# Patient Record
Sex: Male | Born: 1975
Health system: Southern US, Community
[De-identification: ages and names within clinical notes are randomized; demographics above are authoritative.]

## PROBLEM LIST (undated history)

## (undated) DIAGNOSIS — N62 Hypertrophy of breast: Secondary | ICD-10-CM

## (undated) DIAGNOSIS — F411 Generalized anxiety disorder: Secondary | ICD-10-CM

## (undated) DIAGNOSIS — M25519 Pain in unspecified shoulder: Secondary | ICD-10-CM

## (undated) DIAGNOSIS — J309 Allergic rhinitis, unspecified: Secondary | ICD-10-CM

## (undated) DIAGNOSIS — F909 Attention-deficit hyperactivity disorder, unspecified type: Secondary | ICD-10-CM

## (undated) HISTORY — DX: Pain in unspecified shoulder: M25.519

## (undated) HISTORY — DX: Attention-deficit hyperactivity disorder, unspecified type: F90.9

## (undated) HISTORY — PX: LASIK: SHX215

## (undated) HISTORY — DX: Hypertrophy of breast: N62

## (undated) HISTORY — DX: Allergic rhinitis, unspecified: J30.9

## (undated) HISTORY — DX: Generalized anxiety disorder: F41.1

---

## 2006-05-05 ENCOUNTER — Encounter: Admission: RE | Admit: 2006-05-05 | Discharge: 2006-05-05 | Payer: Self-pay | Admitting: Internal Medicine

## 2008-10-09 ENCOUNTER — Ambulatory Visit: Payer: Self-pay | Admitting: Internal Medicine

## 2008-10-09 DIAGNOSIS — F411 Generalized anxiety disorder: Secondary | ICD-10-CM

## 2008-10-09 DIAGNOSIS — J309 Allergic rhinitis, unspecified: Secondary | ICD-10-CM

## 2008-10-09 HISTORY — DX: Generalized anxiety disorder: F41.1

## 2008-10-09 HISTORY — DX: Allergic rhinitis, unspecified: J30.9

## 2008-10-19 ENCOUNTER — Ambulatory Visit: Payer: Self-pay | Admitting: Internal Medicine

## 2008-10-19 DIAGNOSIS — M25519 Pain in unspecified shoulder: Secondary | ICD-10-CM | POA: Insufficient documentation

## 2008-10-19 HISTORY — DX: Pain in unspecified shoulder: M25.519

## 2009-01-23 ENCOUNTER — Ambulatory Visit: Payer: Self-pay | Admitting: Internal Medicine

## 2009-01-23 DIAGNOSIS — IMO0002 Reserved for concepts with insufficient information to code with codable children: Secondary | ICD-10-CM | POA: Insufficient documentation

## 2009-02-05 ENCOUNTER — Ambulatory Visit: Payer: Self-pay | Admitting: Family Medicine

## 2009-04-10 ENCOUNTER — Ambulatory Visit: Payer: Self-pay | Admitting: Internal Medicine

## 2009-07-23 ENCOUNTER — Ambulatory Visit: Payer: Self-pay | Admitting: Internal Medicine

## 2009-07-23 LAB — CONVERTED CEMR LAB
ALT: 35 units/L (ref 0–53)
Albumin: 4.4 g/dL (ref 3.5–5.2)
BUN: 8 mg/dL (ref 6–23)
Basophils Relative: 0.5 % (ref 0.0–3.0)
Bilirubin Urine: NEGATIVE
CO2: 28 meq/L (ref 19–32)
Chloride: 104 meq/L (ref 96–112)
Cholesterol: 155 mg/dL (ref 0–200)
Creatinine, Ser: 0.6 mg/dL (ref 0.4–1.5)
Eosinophils Absolute: 0.1 10*3/uL (ref 0.0–0.7)
Eosinophils Relative: 1 % (ref 0.0–5.0)
HCT: 41.6 % (ref 39.0–52.0)
Ketones, urine, test strip: NEGATIVE
LDL Cholesterol: 93 mg/dL (ref 0–99)
Lymphs Abs: 2.8 10*3/uL (ref 0.7–4.0)
MCHC: 34.5 g/dL (ref 30.0–36.0)
MCV: 99.2 fL (ref 78.0–100.0)
Monocytes Absolute: 0.5 10*3/uL (ref 0.1–1.0)
Neutrophils Relative %: 54.9 % (ref 43.0–77.0)
Potassium: 4 meq/L (ref 3.5–5.1)
RBC: 4.19 M/uL — ABNORMAL LOW (ref 4.22–5.81)
Specific Gravity, Urine: 1.01
TSH: 0.73 microintl units/mL (ref 0.35–5.50)
Total CHOL/HDL Ratio: 3
Total Protein: 6.9 g/dL (ref 6.0–8.3)
Triglycerides: 65 mg/dL (ref 0.0–149.0)
WBC: 7.5 10*3/uL (ref 4.5–10.5)

## 2009-07-27 ENCOUNTER — Ambulatory Visit: Payer: Self-pay | Admitting: Internal Medicine

## 2009-10-01 ENCOUNTER — Ambulatory Visit: Payer: Self-pay | Admitting: Internal Medicine

## 2009-10-01 DIAGNOSIS — J069 Acute upper respiratory infection, unspecified: Secondary | ICD-10-CM | POA: Insufficient documentation

## 2009-10-18 ENCOUNTER — Telehealth: Payer: Self-pay | Admitting: Internal Medicine

## 2010-04-16 ENCOUNTER — Ambulatory Visit: Payer: Self-pay | Admitting: Internal Medicine

## 2010-04-16 DIAGNOSIS — N62 Hypertrophy of breast: Secondary | ICD-10-CM | POA: Insufficient documentation

## 2010-04-16 HISTORY — DX: Hypertrophy of breast: N62

## 2010-07-22 ENCOUNTER — Ambulatory Visit: Payer: Self-pay | Admitting: Internal Medicine

## 2010-07-22 LAB — CONVERTED CEMR LAB
ALT: 43 units/L (ref 0–53)
AST: 24 units/L (ref 0–37)
Albumin: 4.5 g/dL (ref 3.5–5.2)
Basophils Absolute: 0 10*3/uL (ref 0.0–0.1)
Bilirubin Urine: NEGATIVE
CO2: 27 meq/L (ref 19–32)
Chloride: 102 meq/L (ref 96–112)
Cholesterol: 165 mg/dL (ref 0–200)
Eosinophils Relative: 1.9 % (ref 0.0–5.0)
GFR calc non Af Amer: 150.1 mL/min (ref >60)
Glucose, Bld: 85 mg/dL (ref 70–99)
Glucose, Urine, Semiquant: NEGATIVE
HCT: 42.4 % (ref 39.0–52.0)
Hemoglobin: 14.4 g/dL (ref 13.0–17.0)
Lymphs Abs: 2.7 10*3/uL (ref 0.7–4.0)
MCV: 96.7 fL (ref 78.0–100.0)
Monocytes Absolute: 0.5 10*3/uL (ref 0.1–1.0)
Monocytes Relative: 8 % (ref 3.0–12.0)
Neutro Abs: 2.4 10*3/uL (ref 1.4–7.7)
Potassium: 4.6 meq/L (ref 3.5–5.1)
RDW: 12.1 % (ref 11.5–14.6)
Sodium: 138 meq/L (ref 135–145)
Specific Gravity, Urine: <1.005
TSH: 0.87 microintl units/mL (ref 0.35–5.50)
VLDL: 13 mg/dL (ref 0.0–40.0)
WBC Urine, dipstick: NEGATIVE
pH: 5.5

## 2010-07-29 ENCOUNTER — Ambulatory Visit: Payer: Self-pay | Admitting: Internal Medicine

## 2010-09-24 NOTE — Progress Notes (Signed)
Summary: substitute  Phone Note From Pharmacy   Caller: Annalee Genta.* # [phone]* Call For: Dr. Kirtland Bouchard  Summary of Call: Can they substitute Hycodan?  Rondec DM?  Hydrotuss discontinued. [phone]  [facility] Initial call taken by: Lynann Beaver CMA,  October 18, 2009 3:53 PM  Follow-up for Phone Call        yes; generic med OK  Follow-up by: Gordy Savers  MD,  October 18, 2009 4:54 PM  Additional Follow-up for Phone Call Additional follow up Details #1::        Pharmacy notified. Additional Follow-up by: Lynann Beaver CMA,  October 18, 2009 4:57 PM

## 2010-09-24 NOTE — Assessment & Plan Note (Signed)
Summary: cpx/cjr   Vital Signs:  Patient profile:   35 year old male Height:      65.5 inches Weight:      199 pounds BMI:     32.73 Temp:     98.3 degrees F oral BP sitting:   112 / 80  (right arm) Cuff size:   regular  Vitals Entered By: Duard Brady LPN (July 29, 2010 3:29 PM) CC: cpx - doing well Is Patient Diabetic? No   CC:  cpx - doing well.  History of Present Illness: 35 year old patient who is seen today for an annual examination.  He is in quite well and does have a history gynecomastia since age 35.  No new concerns or complaints.  He has chronic left shoulder pain, anxiety, and allergic rhinitis.  Preventive Screening-Counseling & Management  Alcohol-Tobacco     Smoking Status: never  Allergies (verified): No Known Drug Allergies  Past History:  Past Medical History: Allergic rhinitis Anxiety left shoulder pain gynecomastia exogenous obesity  Past Surgical History: Reviewed history from 10/09/2008 and no changes required. LASIK surgery  Family History: Reviewed history from 07/27/2009 and no changes required. father age 33 history tobacco use, but no medical illnesses; Bilat carotid atery stenosis; s/p CABG  mother, age 35 history multiple myeloma-status post stem cell transplantation July 2008 and presently in remission  One stepsister one stepbrother, both in good health  Social History: Reviewed history from 10/09/2008 and no changes required. 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    Physical Exam  General:  overweight-appearing.  normal blood pressureoverweight-appearing.   Head:  Normocephalic and atraumatic without obvious abnormalities. No apparent alopecia or balding. Eyes:  No corneal or conjunctival inflammation noted. EOMI. Perrla. Funduscopic exam benign, without hemorrhages, exudates or papilledema. Vision grossly normal. Ears:  External ear exam shows no significant lesions or deformities.  Otoscopic examination reveals clear canals, tympanic membranes are intact bilaterally without bulging, retraction, inflammation or discharge. Hearing is grossly normal bilaterally. Nose:  External nasal examination shows no deformity or inflammation. Nasal mucosa are pink and moist without lesions or exudates. Mouth:  Oral mucosa and oropharynx without lesions or exudates.  Teeth in good repair. Neck:  No deformities, masses, or tenderness noted. Chest Wall:  No deformities, masses, tenderness or gynecomastia noted. Breasts:  gynecomastia.  gynecomastia.   Lungs:  Normal respiratory effort, chest expands symmetrically. Lungs are clear to auscultation, no crackles or wheezes. Heart:  Normal rate and regular rhythm. S1 and S2 normal without gallop, murmur, click, rub or other extra sounds. Abdomen:  Bowel sounds positive,abdomen soft and non-tender without masses, organomegaly or hernias noted. Genitalia:  Testes bilaterally descended without nodularity, tenderness or masses. No scrotal masses or lesions. No penis lesions or urethral discharge. Msk:  No deformity or scoliosis noted of thoracic or lumbar spine.   Pulses:  R and L carotid,radial,femoral,dorsalis pedis and posterior tibial pulses are full and equal bilaterally Extremities:  No clubbing, cyanosis, edema, or deformity noted with normal full range of motion of all joints.   Neurologic:  No  cranial nerve deficits  noted. Station and gait are normal. Plantar reflexes are down-going bilaterally. DTRs are symmetrical throughout. Sensory, motor and coordinative functions appear intact. Skin:  Intact without suspicious lesions or rashes Cervical Nodes:  No lymphadenopathy noted Axillary Nodes:  No palpable lymphadenopathy Inguinal Nodes:  No significant adenopathy Psych:  Cognition and judgment appear intact. Alert and cooperative with normal attention span and concentration. No apparent delusions, illusions, hallucinations   Impression & Recommendations:  Problem # 1:  Preventive Health Care (ICD-V70.0)  Complete Medication List: 1)  Alprazolam 0.5 Mg Tbdp (Alprazolam) .Marland Kitchen.. 1-2 as needed 2)  Zoloft 50 Mg Tabs (Sertraline hcl) .Marland Kitchen.. 1 once daily 3)  Flonase 50 Mcg/act Susp (Fluticasone propionate) .... Uad 4)  Hydrocodone-acetaminophen 5-500 Mg Tabs (Hydrocodone-acetaminophen) .... One twice daily as needed for pain 5)  Astepro 0.15 % Soln (Azelastine hcl) .... Daily 6)  Allegra-d 12 Hour 60-120 Mg Xr12h-tab (Fexofenadine-pseudoephedrine) .... Once daily prn  Patient Instructions: 1)  Please schedule a follow-up appointment in 1 year. 2)  Limit your Sodium (Salt) to less than 2 grams a day(slightly less than 1/2 a teaspoon) to prevent fluid retention, swelling, or worsening of symptoms. 3)  It is important that you exercise regularly at least 20 minutes 5 times a week. If you develop chest pain, have severe difficulty breathing, or feel very tired , stop exercising immediately and seek medical attention. 4)  You need to lose weight. Consider a lower calorie diet and regular exercise.  Prescriptions: ASTEPRO 0.15 % SOLN (AZELASTINE HCL) daily  #3 x 6   Entered and Authorized by:   Gordy Savers  MD   Signed by:   Gordy Savers  MD on 07/29/2010   Method used:   Print then Give to Patient   RxID:   [id number] HYDROCODONE-ACETAMINOPHEN 5-500 MG TABS  (HYDROCODONE-ACETAMINOPHEN) one twice daily as needed for pain  #50 x 2   Entered and Authorized by:   Gordy Savers  MD   Signed by:   Gordy Savers  MD on 07/29/2010   Method used:   Print then Give to Patient   RxID:   [id number] FLONASE 50 MCG/ACT SUSP (FLUTICASONE PROPIONATE) UAD  #3 x 5   Entered and Authorized by:   Gordy Savers  MD   Signed by:   Gordy Savers  MD on 07/29/2010   Method used:   Print then Give to Patient   RxID:   [id number] ZOLOFT 50 MG TABS (SERTRALINE HCL) 1 once daily  #90 x 5   Entered and Authorized by:   Gordy Savers  MD   Signed by:   Gordy Savers  MD on 07/29/2010   Method used:   Print then Give to Patient   RxID:   [id number] ALPRAZOLAM 0.5 MG TBDP (ALPRAZOLAM) 1-2 as needed  #50 x 3   Entered and Authorized by:   Gordy Savers  MD   Signed by:   Gordy Savers  MD on 07/29/2010   Method used:   Print then Give to Patient   RxID:   [id number]    Orders Added: 1)  Est. Patient 18-39 years [30160]   Immunization History:  Influenza Immunization History:    Influenza:  Historical (06/25/2010) flu vaccine recv'd at work per pt. KIk  Immunization History:  Influenza Immunization History:    Influenza:  Historical (06/25/2010)

## 2010-09-24 NOTE — Progress Notes (Signed)
Summary: wants Rx  Phone Note Call from Patient Call back at Home Phone [phone]   Caller: Patient Call For: Gordy Savers  MD Summary of Call: wants Rx called in- c/o temp 100, chills, dry cough and achy. [facility], [place] Initial call taken by: Raechel Ache, RN,  October 18, 2009 8:54 AM  Follow-up for Phone Call        generic hydrotuss 6 oz one tsp every  6 hrs for cough Follow-up by: Gordy Savers  MD,  October 18, 2009 12:31 PM  Additional Follow-up for Phone Call Additional follow up Details #1::        Rx Called In Additional Follow-up by: Raechel Ache, RN,  October 18, 2009 12:43 PM

## 2010-09-24 NOTE — Assessment & Plan Note (Signed)
Summary: MED CHECK/REFILL/CJR   Vital Signs:  Patient profile:   35 year old male Weight:      197 pounds Temp:     98.1 degrees F oral BP sitting:   120 / 80  (left arm) Cuff size:   regular  Vitals Entered By: Duard Brady LPN (April 16, 2010 1:54 PM) CC: medication review with refills Is Patient Diabetic? No   CC:  medication review with refills.  History of Present Illness:  35 year old patient who is seen today for follow-up.  He has a history of anxiety, which has has been well-controlled.  He is contemplating liposuction for bilateral gynecomastia which is had since age 27.  He has chronic left shoulder pain due to a history of frequent dislocations.  He takes analgesics occasionally for pain.  a pain contract was reviewed and signed today.  Preventive Screening-Counseling & Management  Alcohol-Tobacco     Smoking Status: never  Allergies (verified): No Known Drug Allergies  Past History:  Past Medical History: Reviewed history from 10/19/2008 and no changes required. Allergic rhinitis Anxiety left shoulder pain  Past Surgical History: Reviewed history from 10/09/2008 and no changes required. LASIK surgery  Social History: Smoking Status:  never  Physical Exam  General:  overweight-appearing.   130/80 Head:  Normocephalic and atraumatic without obvious abnormalities. No apparent alopecia or balding. Eyes:  No corneal or conjunctival inflammation noted. EOMI. Perrla. Funduscopic exam benign, without hemorrhages, exudates or papilledema. Vision grossly normal. Mouth:  Oral mucosa and oropharynx without lesions or exudates.  Teeth in good repair. Neck:  No deformities, masses, or tenderness noted. Breasts:  gynecomastia noted Lungs:  Normal respiratory effort, chest expands symmetrically. Lungs are clear to auscultation, no crackles or wheezes. Heart:  Normal rate and regular rhythm. S1 and S2 normal without gallop, murmur, click, rub or other extra  sounds. Abdomen:  Bowel sounds positive,abdomen soft and non-tender without masses, organomegaly or hernias noted. Msk:  No deformity or scoliosis noted of thoracic or lumbar spine.   Pulses:  R and L carotid,radial,femoral,dorsalis pedis and posterior tibial pulses are full and equal bilaterally Extremities:  No clubbing, cyanosis, edema, or deformity noted with normal full range of motion of all joints.     Impression & Recommendations:  Problem # 1:  SHOULDER PAIN, LEFT, CHRONIC (ICD-719.41)  His updated medication list for this problem includes:    Hydrocodone-acetaminophen 5-500 Mg Tabs (Hydrocodone-acetaminophen) ..... One twice daily as needed for pain  Problem # 2:  ANXIETY (ICD-300.00)  His updated medication list for this problem includes:    Alprazolam 0.5 Mg Tbdp (Alprazolam) .Marland Kitchen... 1-2 as needed    Zoloft 50 Mg Tabs (Sertraline hcl) .Marland Kitchen... 1 once daily  Problem # 3:  GYNECOMASTIA (ICD-611.1)  Complete Medication List: 1)  Alprazolam 0.5 Mg Tbdp (Alprazolam) .Marland Kitchen.. 1-2 as needed 2)  Zoloft 50 Mg Tabs (Sertraline hcl) .Marland Kitchen.. 1 once daily 3)  Flonase 50 Mcg/act Susp (Fluticasone propionate) .... Uad 4)  Hydrocodone-acetaminophen 5-500 Mg Tabs (Hydrocodone-acetaminophen) .... One twice daily as needed for pain 5)  Astepro 0.15 % Soln (Azelastine hcl) .... Daily 6)  Allegra-d 12 Hour 60-120 Mg Xr12h-tab (Fexofenadine-pseudoephedrine) .... Once daily prn  Patient Instructions: 1)  Please schedule a follow-up appointment in 6 months. 2)  It is important that you exercise regularly at least 20 minutes 5 times a week. If you develop chest pain, have severe difficulty breathing, or feel very tired , stop exercising immediately and seek medical attention. 3)  You  need to lose weight. Consider a lower calorie diet and regular exercise.  Prescriptions: HYDROCODONE-ACETAMINOPHEN 5-500 MG TABS (HYDROCODONE-ACETAMINOPHEN) one twice daily as needed for pain  #50 x 2   Entered and Authorized  by:   Gordy Savers  MD   Signed by:   Gordy Savers  MD on 04/16/2010   Method used:   Print then Give to Patient   RxID:   [id number] ASTEPRO 0.15 % SOLN (AZELASTINE HCL) daily  #3 x 6   Entered and Authorized by:   Gordy Savers  MD   Signed by:   Gordy Savers  MD on 04/16/2010   Method used:   Print then Give to Patient   RxID:   [id number] FLONASE 50 MCG/ACT SUSP (FLUTICASONE PROPIONATE) UAD  #3 x 5   Entered and Authorized by:   Gordy Savers  MD   Signed by:   Gordy Savers  MD on 04/16/2010   Method used:   Print then Give to Patient   RxID:   [id number] ZOLOFT 50 MG TABS (SERTRALINE HCL) 1 once daily  #90 x 5   Entered and Authorized by:   Gordy Savers  MD   Signed by:   Gordy Savers  MD on 04/16/2010   Method used:   Print then Give to Patient   RxID:   [id number]   Appended Document: MED CHECK/REFILL/CJR    Clinical Lists Changes  Medications: Changed medication from ALPRAZOLAM 0.5 MG TBDP (ALPRAZOLAM) 1-2 as needed to ALPRAZOLAM 0.5 MG TBDP (ALPRAZOLAM) 1-2 as needed Changed medication from HYDROCODONE-ACETAMINOPHEN 5-500 MG TABS (HYDROCODONE-ACETAMINOPHEN) one twice daily as needed for pain to HYDROCODONE-ACETAMINOPHEN 5-500 MG TABS (HYDROCODONE-ACETAMINOPHEN) one twice daily as needed for pain

## 2010-09-24 NOTE — Miscellaneous (Signed)
Summary: Controlled Substances Contract  Controlled Substances Contract   Imported By: Maryln Gottron 04/18/2010 14:03:56  _____________________________________________________________________  External Attachment:    Type:   Image     Comment:   External Document

## 2010-09-24 NOTE — Assessment & Plan Note (Signed)
Summary: CONCERNS W/ MED // RS   Vital Signs:  Patient profile:   35 year old male Weight:      182 pounds Temp:     98.3 degrees F oral BP sitting:   122 / 80  (right arm) Cuff size:   regular  Vitals Entered By: Raechel Ache, RN (October 01, 2009 11:32 AM) CC: cough, sore throat, joint aches   CC:  cough, sore throat, and joint aches.  History of Present Illness: 35 year old patient history allergic rhinitis, who presents with a 3-day history of sore throat, cough, generalized myalgias.  He has a history of chronic left shoulder pain that worsens during the winter and largely resolves during the warmer months.  Denies any fever, shortness of breath, chest pain or purulent sputum production  Allergies: No Known Drug Allergies  Past History:  Past Medical History: Reviewed history from 10/19/2008 and no changes required. Allergic rhinitis Anxiety left shoulder pain  Review of Systems       The patient complains of prolonged cough.  The patient denies anorexia, fever, weight loss, weight gain, vision loss, decreased hearing, hoarseness, chest pain, syncope, dyspnea on exertion, peripheral edema, headaches, hemoptysis, abdominal pain, melena, hematochezia, severe indigestion/heartburn, hematuria, incontinence, genital sores, muscle weakness, suspicious skin lesions, transient blindness, difficulty walking, depression, unusual weight change, abnormal bleeding, enlarged lymph nodes, angioedema, breast masses, and testicular masses.    Physical Exam  General:  Well-developed,well-nourished,in no acute distress; alert,appropriate and cooperative throughout examination Head:  Normocephalic and atraumatic without obvious abnormalities. No apparent alopecia or balding. Eyes:  No corneal or conjunctival inflammation noted. EOMI. Perrla. Funduscopic exam benign, without hemorrhages, exudates or papilledema. Vision grossly normal. Ears:  External ear exam shows no significant lesions  or deformities.  Otoscopic examination reveals clear canals, tympanic membranes are intact bilaterally without bulging, retraction, inflammation or discharge. Hearing is grossly normal bilaterally. Mouth:  Oral mucosa and oropharynx without lesions or exudates.  Teeth in good repair. Neck:  No deformities, masses, or tenderness noted. Lungs:  Normal respiratory effort, chest expands symmetrically. Lungs are clear to auscultation, no crackles or wheezes. Heart:  Normal rate and regular rhythm. S1 and S2 normal without gallop, murmur, click, rub or other extra sounds.   Impression & Recommendations:  Problem # 1:  URI (ICD-465.9)  His updated medication list for this problem includes:    Hydrocodone-homatropine 5-1.5 Mg/59ml Syrp (Hydrocodone-homatropine) .Marland Kitchen... 1 teaspoon every 6 hours as needed for cough  His updated medication list for this problem includes:    Hydrocodone-homatropine 5-1.5 Mg/32ml Syrp (Hydrocodone-homatropine) .Marland Kitchen... 1 teaspoon every 6 hours as needed for cough  Problem # 2:  SHOULDER PAIN, LEFT, CHRONIC (ICD-719.41)  His updated medication list for this problem includes:    Hydrocodone-acetaminophen 5-500 Mg Tabs (Hydrocodone-acetaminophen) ..... One twice daily as needed for pain  His updated medication list for this problem includes:    Hydrocodone-acetaminophen 5-500 Mg Tabs (Hydrocodone-acetaminophen) ..... One twice daily as needed for pain  Problem # 3:  ALLERGIC RHINITIS (ICD-477.9)  His updated medication list for this problem includes:    Flonase 50 Mcg/act Susp (Fluticasone propionate) ..... Uad    Astepro 0.15 % Soln (Azelastine hcl) .Marland Kitchen... Daily  His updated medication list for this problem includes:    Flonase 50 Mcg/act Susp (Fluticasone propionate) ..... Uad    Astepro 0.15 % Soln (Azelastine hcl) .Marland Kitchen... Daily  Complete Medication List: 1)  Alprazolam 0.5 Mg Tbdp (Alprazolam) .Marland Kitchen.. 1-2 as needed 2)  Zoloft 50  Mg Tabs (Sertraline hcl) .Marland Kitchen.. 1 once  daily 3)  Flonase 50 Mcg/act Susp (Fluticasone propionate) .... Uad 4)  Hydrocodone-acetaminophen 5-500 Mg Tabs (Hydrocodone-acetaminophen) .... One twice daily as needed for pain 5)  Astepro 0.15 % Soln (Azelastine hcl) .... Daily 6)  Hydrocodone-homatropine 5-1.5 Mg/29ml Syrp (Hydrocodone-homatropine) .Marland Kitchen.. 1 teaspoon every 6 hours as needed for cough  Patient Instructions: 1)  Get plenty of rest, drink lots of clear liquids, and use Tylenol or Ibuprofen for fever and comfort. Return in 7-10 days if you're not better:sooner if you're feeling worse. Prescriptions: HYDROCODONE-HOMATROPINE 5-1.5 MG/5ML SYRP (HYDROCODONE-HOMATROPINE) 1 teaspoon every 6 hours as needed for cough  #6 oz x 0   Entered and Authorized by:   Gordy Savers  MD   Signed by:   Gordy Savers  MD on 10/01/2009   Method used:   Print then Give to Patient   RxID:   [id number] ASTEPRO 0.15 % SOLN (AZELASTINE HCL) daily  #3 x 6   Entered and Authorized by:   Gordy Savers  MD   Signed by:   Gordy Savers  MD on 10/01/2009   Method used:   Print then Give to Patient   RxID:   [id number] FLONASE 50 MCG/ACT SUSP (FLUTICASONE PROPIONATE) UAD  #3 x 5   Entered and Authorized by:   Gordy Savers  MD   Signed by:   Gordy Savers  MD on 10/01/2009   Method used:   Print then Give to Patient   RxID:   [id number] HYDROCODONE-ACETAMINOPHEN 5-500 MG TABS (HYDROCODONE-ACETAMINOPHEN) one twice daily as needed for pain  #50 x 3   Entered and Authorized by:   Gordy Savers  MD   Signed by:   Gordy Savers  MD on 10/01/2009   Method used:   Print then Give to Patient   RxID:   [id number] ZOLOFT 50 MG TABS (SERTRALINE HCL) 1 once daily  #90 x 5   Entered and Authorized by:   Gordy Savers  MD   Signed by:   Gordy Savers  MD on 10/01/2009   Method used:   Print then Give to Patient   RxID:   [id number] ALPRAZOLAM 0.5 MG TBDP (ALPRAZOLAM) 1-2 as  needed  #50 x 3   Entered and Authorized by:   Gordy Savers  MD   Signed by:   Gordy Savers  MD on 10/01/2009   Method used:   Print then Give to Patient   RxID:   [id number]

## 2010-10-28 ENCOUNTER — Other Ambulatory Visit: Payer: Self-pay | Admitting: Internal Medicine

## 2010-10-28 NOTE — Telephone Encounter (Signed)
OK #50; does patient desire referral to ortho for shouilder pain?

## 2010-10-28 NOTE — Telephone Encounter (Signed)
Last seen 07/29/2010 - #50 2RF given at that time

## 2010-10-28 NOTE — Telephone Encounter (Signed)
[facility] 

## 2010-11-25 ENCOUNTER — Encounter: Payer: Self-pay | Admitting: Internal Medicine

## 2010-11-26 ENCOUNTER — Encounter: Payer: Self-pay | Admitting: Internal Medicine

## 2010-11-26 ENCOUNTER — Ambulatory Visit (INDEPENDENT_AMBULATORY_CARE_PROVIDER_SITE_OTHER): Payer: Managed Care, Other (non HMO) | Admitting: Internal Medicine

## 2010-11-26 DIAGNOSIS — F411 Generalized anxiety disorder: Secondary | ICD-10-CM

## 2010-11-26 DIAGNOSIS — M25519 Pain in unspecified shoulder: Secondary | ICD-10-CM

## 2010-11-26 DIAGNOSIS — J309 Allergic rhinitis, unspecified: Secondary | ICD-10-CM

## 2010-11-26 MED ORDER — HYDROCODONE-ACETAMINOPHEN 5-500 MG PO TABS: 1.0000 | ORAL_TABLET | Freq: Three times a day (TID) | ORAL | Status: DC | PRN

## 2010-11-26 MED ORDER — AZELASTINE HCL 0.15 % NA SOLN: 1.0000 "application " | Freq: Every day | NASAL | Status: DC

## 2010-11-26 MED ORDER — SERTRALINE HCL 50 MG PO TABS: 50.0000 mg | ORAL_TABLET | Freq: Every day | ORAL | Status: DC

## 2010-11-26 MED ORDER — FLUTICASONE PROPIONATE 50 MCG/ACT NA SUSP: 2.0000 | Freq: Every day | NASAL | Status: DC

## 2010-11-26 MED ORDER — ALPRAZOLAM 0.5 MG PO TABS: 0.5000 mg | ORAL_TABLET | Freq: Two times a day (BID) | ORAL | Status: DC

## 2010-11-26 NOTE — Progress Notes (Signed)
  Subjective:    Patient ID: Shaun Barron, male    DOB: [birth year], 35 y.o.   MRN: [id number]  HPI  is a 35 year old patient who is seen today for followup. He is on Vicodin for chronic left shoulder and left knee pain. He has been stable on this regimen. He has a history of anxiety depression and has done well on alprazolam and sertraline. He was seen 4 months ago for a physical. Laboratory studies were reviewed and they were unremarkable. His allergy issues seem well controlled on his present regimen. He feels he has done well in general.    Review of Systems  Constitutional: Negative for fever, chills, appetite change and fatigue.  HENT: Negative for hearing loss, ear pain, congestion, sore throat, trouble swallowing, neck stiffness, dental problem, voice change and tinnitus.   Eyes: Negative for pain, discharge and visual disturbance.  Respiratory: Negative for cough, chest tightness, wheezing and stridor.   Cardiovascular: Negative for chest pain, palpitations and leg swelling.  Gastrointestinal: Negative for nausea, vomiting, abdominal pain, diarrhea, constipation, blood in stool and abdominal distention.  Genitourinary: Negative for urgency, hematuria, flank pain, discharge, difficulty urinating and genital sores.  Musculoskeletal: Negative for myalgias, back pain, joint swelling, arthralgias and gait problem.       Chronic left shoulder and left knee pain  Skin: Negative for rash.  Neurological: Negative for dizziness, syncope, speech difficulty, weakness, numbness and headaches.  Hematological: Negative for adenopathy. Does not bruise/bleed easily.  Psychiatric/Behavioral: Negative for behavioral problems and dysphoric mood. The patient is not nervous/anxious.        Objective:   Physical Exam  Constitutional: He is oriented to person, place, and time. He appears well-developed.  HENT:  Head: Normocephalic.  Right Ear: External ear normal.  Left Ear: External ear normal.    Eyes: Conjunctivae and EOM are normal.  Neck: Normal range of motion.  Cardiovascular: Normal rate and normal heart sounds.   Pulmonary/Chest: Breath sounds normal.  Abdominal: Bowel sounds are normal.  Musculoskeletal: Normal range of motion. He exhibits no edema and no tenderness.  Neurological: He is alert and oriented to person, place, and time.  Psychiatric: He has a normal mood and affect. His behavior is normal.          Assessment & Plan:  Allergic rhinitis stable Chronic left shoulder and left knee pain stable Anxiety disorder controlled  Medical regimen unchanged; refills dispensed  We'll recheck in 4 months

## 2010-11-26 NOTE — Patient Instructions (Signed)
Return in 4 months for follow up.

## 2011-02-23 ENCOUNTER — Other Ambulatory Visit: Payer: Self-pay | Admitting: Internal Medicine

## 2011-02-24 ENCOUNTER — Other Ambulatory Visit: Payer: Self-pay | Admitting: Internal Medicine

## 2011-02-24 MED ORDER — HYDROCODONE-ACETAMINOPHEN 5-500 MG PO TABS: 1.0000 | ORAL_TABLET | Freq: Three times a day (TID) | ORAL | Status: DC | PRN

## 2011-02-24 MED ORDER — ALPRAZOLAM 0.5 MG PO TABS: 0.5000 mg | ORAL_TABLET | Freq: Two times a day (BID) | ORAL | Status: DC

## 2011-02-24 NOTE — Telephone Encounter (Signed)
Ok  same Rx

## 2011-02-24 NOTE — Telephone Encounter (Signed)
See other phone note - okay'd

## 2011-02-24 NOTE — Telephone Encounter (Signed)
[facility]dered [facility]

## 2011-02-24 NOTE — Telephone Encounter (Signed)
Pt called and said that [facility] [address] in [facility] sent a refill req for pts Alprazolam and Hydrocodone this past weekend. Pt is leaving to go out of town tomorrow and is needing this to be called in today.

## 2011-02-24 NOTE — Telephone Encounter (Signed)
Please advise - last seen 11/26/10  Alprazolam last written 4/3  #60 3RF       Vicodin last written 4/3/   #50 3RF

## 2011-02-24 NOTE — Telephone Encounter (Signed)
rx's called [facility]

## 2011-02-24 NOTE — Telephone Encounter (Signed)
Please advise - last seen 11/2010  vicodin 11/26/10 #50 3RF Alprazolam 11/26/10 #60 3RF

## 2011-05-26 ENCOUNTER — Other Ambulatory Visit: Payer: Self-pay | Admitting: Internal Medicine

## 2011-05-26 NOTE — Telephone Encounter (Signed)
Last seen 11/26/10 last written 02/24/11 #50 3RF Please advise

## 2011-05-26 NOTE — Telephone Encounter (Signed)
ok 

## 2011-05-26 NOTE — Telephone Encounter (Signed)
done

## 2011-06-16 ENCOUNTER — Encounter: Payer: Self-pay | Admitting: Internal Medicine

## 2011-06-17 ENCOUNTER — Encounter: Payer: Self-pay | Admitting: Internal Medicine

## 2011-06-17 ENCOUNTER — Ambulatory Visit (INDEPENDENT_AMBULATORY_CARE_PROVIDER_SITE_OTHER): Payer: Managed Care, Other (non HMO) | Admitting: Internal Medicine

## 2011-06-17 DIAGNOSIS — F411 Generalized anxiety disorder: Secondary | ICD-10-CM

## 2011-06-17 DIAGNOSIS — J309 Allergic rhinitis, unspecified: Secondary | ICD-10-CM

## 2011-06-17 DIAGNOSIS — M25519 Pain in unspecified shoulder: Secondary | ICD-10-CM

## 2011-06-17 MED ORDER — FLUTICASONE PROPIONATE 50 MCG/ACT NA SUSP: 2.0000 | Freq: Every day | NASAL | Status: DC

## 2011-06-17 MED ORDER — HYDROCODONE-ACETAMINOPHEN 5-500 MG PO TABS: 1.0000 | ORAL_TABLET | Freq: Three times a day (TID) | ORAL | Status: DC | PRN

## 2011-06-17 MED ORDER — ALPRAZOLAM 0.5 MG PO TABS: 0.5000 mg | ORAL_TABLET | Freq: Two times a day (BID) | ORAL | Status: DC

## 2011-06-17 MED ORDER — SERTRALINE HCL 50 MG PO TABS: 50.0000 mg | ORAL_TABLET | Freq: Every day | ORAL | Status: DC

## 2011-06-17 MED ORDER — AZELASTINE HCL 0.15 % NA SOLN: 1.0000 "application " | Freq: Every day | NASAL | Status: DC

## 2011-06-17 NOTE — Patient Instructions (Signed)
It is important that you exercise regularly, at least 20 minutes 3 to 4 times per week.  If you develop chest pain or shortness of breath seek  medical attention.  You need to lose weight.  Consider a lower calorie diet and regular exercise.  Return in 6 months for follow-up  

## 2011-06-17 NOTE — Progress Notes (Signed)
  Subjective:    Patient ID: Shaun Barron, male    DOB: 1976/03/08, 35 y.o.   MRN: [id number]  HPI  35 year old patient who is seen today for followup. He has a history of allergic rhinitis as well as anxiety. He has chronic left shoulder pain and has had orthopedic evaluation in the past. He's had multiple left shoulder dislocations and apparent has a chronic tendinitis bursitis type problem he also has occasional left knee pain. He takes approximately 50 Vicodin per month. He has allergic rhinitis which has been stable.    Review of Systems  Constitutional: Negative for fever, chills, appetite change and fatigue.  HENT: Negative for hearing loss, ear pain, congestion, sore throat, trouble swallowing, neck stiffness, dental problem, voice change and tinnitus.   Eyes: Negative for pain, discharge and visual disturbance.  Respiratory: Negative for cough, chest tightness, wheezing and stridor.   Cardiovascular: Negative for chest pain, palpitations and leg swelling.  Gastrointestinal: Negative for nausea, vomiting, abdominal pain, diarrhea, constipation, blood in stool and abdominal distention.  Genitourinary: Negative for urgency, hematuria, flank pain, discharge, difficulty urinating and genital sores.  Musculoskeletal: Negative for myalgias, back pain, joint swelling, arthralgias and gait problem.       Chronic left shoulder and left knee pain  Skin: Negative for rash.  Neurological: Negative for dizziness, syncope, speech difficulty, weakness, numbness and headaches.  Hematological: Negative for adenopathy. Does not bruise/bleed easily.  Psychiatric/Behavioral: Negative for behavioral problems and dysphoric mood. The patient is nervous/anxious.        Objective:   Physical Exam  Constitutional: He is oriented to person, place, and time. He appears well-developed.       Mildly overweight. Weight 208. Initial blood pressure 120/80 Repeat blood pressure 140/82  HENT:  Head:  Normocephalic.  Right Ear: External ear normal.  Left Ear: External ear normal.  Eyes: Conjunctivae and EOM are normal.  Neck: Normal range of motion.  Cardiovascular: Normal rate and normal heart sounds.   Pulmonary/Chest: Breath sounds normal.  Abdominal: Bowel sounds are normal.  Musculoskeletal: Normal range of motion. He exhibits no edema and no tenderness.  Neurological: He is alert and oriented to person, place, and time.  Psychiatric: He has a normal mood and affect. His behavior is normal.          Assessment & Plan:   Anxiety disorder Chronic left shoulder pain Allergic rhinitis  Medications updated Recheck in 6 months

## 2011-09-15 ENCOUNTER — Other Ambulatory Visit: Payer: Self-pay | Admitting: Internal Medicine

## 2011-09-15 NOTE — Telephone Encounter (Signed)
Called in.

## 2011-09-15 NOTE — Telephone Encounter (Signed)
Last seen 06/17/11  Last written 06/17/11 #50 3RF Please advise

## 2011-09-15 NOTE — Telephone Encounter (Signed)
ok 

## 2011-10-27 ENCOUNTER — Other Ambulatory Visit: Payer: Self-pay | Admitting: Internal Medicine

## 2011-12-23 ENCOUNTER — Encounter: Payer: Self-pay | Admitting: Internal Medicine

## 2011-12-23 ENCOUNTER — Ambulatory Visit (INDEPENDENT_AMBULATORY_CARE_PROVIDER_SITE_OTHER): Payer: Managed Care, Other (non HMO) | Admitting: Internal Medicine

## 2011-12-23 VITALS — BP 132/80 | Temp 98.0°F | Wt 210.0 lb

## 2011-12-23 DIAGNOSIS — M25519 Pain in unspecified shoulder: Secondary | ICD-10-CM

## 2011-12-23 DIAGNOSIS — F411 Generalized anxiety disorder: Secondary | ICD-10-CM

## 2011-12-23 DIAGNOSIS — J309 Allergic rhinitis, unspecified: Secondary | ICD-10-CM

## 2011-12-23 MED ORDER — FEXOFENADINE-PSEUDOEPHED ER 60-120 MG PO TB12: 1.0000 | ORAL_TABLET | Freq: Every day | ORAL | Status: DC | PRN

## 2011-12-23 MED ORDER — FLUTICASONE PROPIONATE 50 MCG/ACT NA SUSP: 2.0000 | Freq: Every day | NASAL | Status: DC

## 2011-12-23 MED ORDER — HYDROCODONE-ACETAMINOPHEN 5-500 MG PO TABS: 1.0000 | ORAL_TABLET | Freq: Three times a day (TID) | ORAL | Status: DC | PRN

## 2011-12-23 MED ORDER — SERTRALINE HCL 50 MG PO TABS: 50.0000 mg | ORAL_TABLET | Freq: Every day | ORAL | Status: DC

## 2011-12-23 MED ORDER — ALPRAZOLAM 0.5 MG PO TABS: 0.5000 mg | ORAL_TABLET | Freq: Three times a day (TID) | ORAL | Status: DC | PRN

## 2011-12-23 MED ORDER — AZELASTINE HCL 0.15 % NA SOLN: 1.0000 "application " | Freq: Every day | NASAL | Status: DC

## 2011-12-23 NOTE — Progress Notes (Signed)
  Subjective:    Patient ID: Shaun Barron, male    DOB: 1975/09/05, 36 y.o.   MRN: [id number]  HPI  36 year old patient who is seen today for his six-month followup. He has a history of the anxiety and allergic rhinitis. He lost his mother earlier this month due to the acute unexpected illness. It appears she died from pneumococcal pneumonia and septicemia. In general he does quite well he has also had chronic left shoulder pain. He requires refill on his allergy medication    Review of Systems  Constitutional: Negative for fever, chills, appetite change and fatigue.  HENT: Positive for congestion and rhinorrhea. Negative for hearing loss, ear pain, sore throat, trouble swallowing, neck stiffness, dental problem, voice change and tinnitus.   Eyes: Negative for pain, discharge and visual disturbance.  Respiratory: Negative for cough, chest tightness, wheezing and stridor.   Cardiovascular: Negative for chest pain, palpitations and leg swelling.  Gastrointestinal: Negative for nausea, vomiting, abdominal pain, diarrhea, constipation, blood in stool and abdominal distention.  Genitourinary: Negative for urgency, hematuria, flank pain, discharge, difficulty urinating and genital sores.  Musculoskeletal: Negative for myalgias, back pain, joint swelling, arthralgias and gait problem.  Skin: Negative for rash.  Neurological: Negative for dizziness, syncope, speech difficulty, weakness, numbness and headaches.  Hematological: Negative for adenopathy. Does not bruise/bleed easily.  Psychiatric/Behavioral: Positive for dysphoric mood. Negative for behavioral problems. The patient is nervous/anxious.        Objective:   Physical Exam  Constitutional: He is oriented to person, place, and time. He appears well-developed.       Overweight. Blood pressure normal. No distress  HENT:  Head: Normocephalic.  Right Ear: External ear normal.  Left Ear: External ear normal.  Eyes: Conjunctivae and EOM  are normal.  Neck: Normal range of motion.  Cardiovascular: Normal rate and normal heart sounds.   Pulmonary/Chest: Breath sounds normal.  Abdominal: Bowel sounds are normal.  Musculoskeletal: Normal range of motion. He exhibits no edema and no tenderness.  Neurological: He is alert and oriented to person, place, and time.  Psychiatric: He has a normal mood and affect. His behavior is normal. Judgment and thought content normal.          Assessment & Plan:   Allergic rhinitis Anxiety/grief reaction  All medications refilled Recheck 6 months

## 2011-12-23 NOTE — Patient Instructions (Signed)
It is important that you exercise regularly, at least 20 minutes 3 to 4 times per week.  If you develop chest pain or shortness of breath seek  medical attention.  Return in 6 months for follow-up  

## 2012-03-15 ENCOUNTER — Other Ambulatory Visit: Payer: Self-pay | Admitting: Internal Medicine

## 2012-03-15 NOTE — Telephone Encounter (Signed)
Last seen 12/23/11 6 mos rov  Last written 12/23/11 # 60 2RF Please advise

## 2012-03-15 NOTE — Telephone Encounter (Signed)
ok 

## 2012-03-15 NOTE — Telephone Encounter (Signed)
called in 

## 2012-04-16 ENCOUNTER — Other Ambulatory Visit: Payer: Managed Care, Other (non HMO)

## 2012-04-19 ENCOUNTER — Other Ambulatory Visit (INDEPENDENT_AMBULATORY_CARE_PROVIDER_SITE_OTHER): Payer: Managed Care, Other (non HMO)

## 2012-04-19 DIAGNOSIS — Z0000 Encounter for general adult medical examination without abnormal findings: Secondary | ICD-10-CM

## 2012-04-19 LAB — LIPID PANEL
LDL Cholesterol: 84 mg/dL (ref 0–99)
Total CHOL/HDL Ratio: 3
Triglycerides: 73 mg/dL (ref 0.0–149.0)

## 2012-04-19 LAB — CBC WITH DIFFERENTIAL/PLATELET
Basophils Relative: 0.7 % (ref 0.0–3.0)
Eosinophils Relative: 2.1 % (ref 0.0–5.0)
Lymphocytes Relative: 47.3 % — ABNORMAL HIGH (ref 12.0–46.0)
Monocytes Relative: 6.6 % (ref 3.0–12.0)
Neutrophils Relative %: 43.3 % (ref 43.0–77.0)
RBC: 4.5 Mil/uL (ref 4.22–5.81)
WBC: 7.3 10*3/uL (ref 4.5–10.5)

## 2012-04-19 LAB — POCT URINALYSIS DIPSTICK
Blood, UA: NEGATIVE
Glucose, UA: NEGATIVE
Nitrite, UA: NEGATIVE
Urobilinogen, UA: 0.2

## 2012-04-19 LAB — HEPATIC FUNCTION PANEL
ALT: 55 U/L — ABNORMAL HIGH (ref 0–53)
AST: 26 U/L (ref 0–37)
Alkaline Phosphatase: 59 U/L (ref 39–117)
Bilirubin, Direct: 0.1 mg/dL (ref 0.0–0.3)
Total Protein: 7.2 g/dL (ref 6.0–8.3)

## 2012-04-19 LAB — BASIC METABOLIC PANEL
Calcium: 8.9 mg/dL (ref 8.4–10.5)
Creatinine, Ser: 0.6 mg/dL (ref 0.4–1.5)
Sodium: 135 mEq/L (ref 135–145)

## 2012-04-23 ENCOUNTER — Ambulatory Visit: Payer: Managed Care, Other (non HMO) | Admitting: Internal Medicine

## 2012-04-27 ENCOUNTER — Ambulatory Visit: Payer: Managed Care, Other (non HMO) | Admitting: Internal Medicine

## 2012-04-29 ENCOUNTER — Encounter: Payer: Self-pay | Admitting: Internal Medicine

## 2012-04-29 ENCOUNTER — Ambulatory Visit (INDEPENDENT_AMBULATORY_CARE_PROVIDER_SITE_OTHER): Payer: Managed Care, Other (non HMO) | Admitting: Internal Medicine

## 2012-04-29 VITALS — BP 150/100 | Temp 98.1°F | Wt 214.0 lb

## 2012-04-29 DIAGNOSIS — J309 Allergic rhinitis, unspecified: Secondary | ICD-10-CM

## 2012-04-29 DIAGNOSIS — F411 Generalized anxiety disorder: Secondary | ICD-10-CM

## 2012-04-29 MED ORDER — SERTRALINE HCL 50 MG PO TABS: 50.0000 mg | ORAL_TABLET | Freq: Every day | ORAL | Status: DC

## 2012-04-29 MED ORDER — ALPRAZOLAM 0.5 MG PO TABS: 0.5000 mg | ORAL_TABLET | Freq: Three times a day (TID) | ORAL | Status: DC | PRN

## 2012-04-29 MED ORDER — HYDROCODONE-ACETAMINOPHEN 5-500 MG PO TABS: 1.0000 | ORAL_TABLET | Freq: Four times a day (QID) | ORAL | Status: DC | PRN

## 2012-04-29 NOTE — Progress Notes (Signed)
Subjective:    Patient ID: Shaun Barron, male    DOB: 11-16-1975, 36 y.o.   MRN: [id number]  HPI  36 year old patient who is seen today for followup. He has a history of anxiety disorder which is treated with sertraline as well as when necessary alprazolam. He takes a ladder easily once daily close to bedtime. He has allergic rhinitis which has been stable. No new concerns or complaints. He did have a laboratory health screen performed the last week this was reviewed and was unremarkable. Blood pressure on arrival today was slightly elevated but no prior history of hypertension  Past Medical History  Diagnosis Date  . ALLERGIC RHINITIS 10/09/2008  . ANXIETY 10/09/2008  . GYNECOMASTIA 04/16/2010  . SHOULDER PAIN, LEFT, CHRONIC 10/19/2008    History   Social History  . Marital Status: Single    Spouse Name: N/A    Number of Children: N/A  . 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 Smoking status: Former Smoker    Quit date: 09/25/2005  . Smokeless tobacco: Never Used  . Alcohol Use: Yes     rarely  . Drug Use: No  . Sexually Active: Not on file   Other Topics Concern  . Not on file   Social History Narrative   One step sister and one step brother, both in good health33 year old son, 4-year old daughter    Past Surgical History  Procedure Date  . Lasik     Family History  Problem Relation Age of Onset  . Coronary artery disease Father     bilat carotid artery stenosis:s/p CABG  . Cancer Mother     hx multiple myeloma-status post stem transpantation July 2008 and presently in remission    No Known Allergies  Current Outpatient Prescriptions on File Prior to Visit  Medication Sig Dispense Refill  . ALPRAZolam (XANAX) 0.5 MG tablet Take 1 tablet (0.5 mg total) by mouth 3 (three) times daily as needed for sleep.  60 tablet  3  . Azelastine HCl (ASTEPRO) 0.15 % SOLN Place 1 application into the nose  daily.  5 mL  6  . fexofenadine-pseudoephedrine (ALLEGRA-D) 60-120 MG per tablet Take 1 tablet by mouth daily as needed.  60 tablet  3  . fluticasone (FLONASE) 50 MCG/ACT nasal spray Place 2 sprays into the nose daily.  16 g  6  . HYDROcodone-acetaminophen (VICODIN) 5-500 MG per tablet TAKE ONE TABLET BY MOUTH EVERY 8 HOURS AS NEEDED FOR PAIN  60 tablet  1  . sertraline (ZOLOFT) 50 MG tablet Take 1 tablet (50 mg total) by mouth daily.  90 tablet  4    BP 150/100  Temp 98.1 F (36.7 C) (Oral)  Wt 214 lb (97.07 kg)     BP Readings from Last 3 Encounters:  04/29/12 150/100  12/23/11 132/80  06/17/11 120/80    Review of Systems  Constitutional: Positive for unexpected weight change. Negative for fever, chills, appetite change and fatigue.  HENT: Negative for hearing loss, ear pain, congestion, sore throat, trouble swallowing, neck stiffness, dental problem, voice change and tinnitus.   Eyes: Negative for pain, discharge and visual disturbance.  Respiratory: Negative for cough, chest tightness, wheezing and stridor.   Cardiovascular: Negative for chest pain, palpitations and leg swelling.  Gastrointestinal: Negative for nausea, vomiting, abdominal pain, diarrhea, constipation, blood in stool and abdominal distention.  Genitourinary: Negative for urgency, hematuria, flank  pain, discharge, difficulty urinating and genital sores.  Musculoskeletal: Negative for myalgias, back pain, joint swelling, arthralgias and gait problem.  Skin: Negative for rash.  Neurological: Negative for dizziness, syncope, speech difficulty, weakness, numbness and headaches.  Hematological: Negative for adenopathy. Does not bruise/bleed easily.  Psychiatric/Behavioral: Negative for behavioral problems and dysphoric mood. The patient is nervous/anxious.        Objective:   Physical Exam  Constitutional: He is oriented to person, place, and time. He appears well-developed.       Repeat blood pressure  139/89  Weight 214  HENT:  Head: Normocephalic.  Right Ear: External ear normal.  Left Ear: External ear normal.  Eyes: Conjunctivae and EOM are normal.  Neck: Normal range of motion.  Cardiovascular: Normal rate and normal heart sounds.   Pulmonary/Chest: Breath sounds normal.  Abdominal: Bowel sounds are normal.  Musculoskeletal: Normal range of motion. He exhibits no edema and no tenderness.  Neurological: He is alert and oriented to person, place, and time.  Psychiatric: He has a normal mood and affect. His behavior is normal.          Assessment & Plan:   Anxiety disorder stable Exogenous obesity Weight loss encouraged  Medical regimen unchanged Recheck 6 months home blood pressure monitor and encouraged

## 2012-04-29 NOTE — Patient Instructions (Signed)
Limit your sodium (Salt) intake  You need to lose weight.  Consider a lower calorie diet and regular exercise.    It is important that you exercise regularly, at least 20 minutes 3 to 4 times per week.  If you develop chest pain or shortness of breath seek  medical attention.  Please check your blood pressure on a regular basis.  If it is consistently greater than 150/90, please make an office appointment.  DASH Diet The DASH diet stands for "Dietary Approaches to Stop Hypertension." It is a healthy eating plan that has been shown to reduce high blood pressure (hypertension) in as little as 14 days, while also possibly providing other significant health benefits. These other health benefits include reducing the risk of breast cancer after menopause and reducing the risk of type 2 diabetes, heart disease, colon cancer, and stroke. Health benefits also include weight loss and slowing kidney failure in patients with chronic kidney disease.   DIET GUIDELINES  Limit salt (sodium). Your diet should contain less than 1500 mg of sodium daily.   Limit refined or processed carbohydrates. Your diet should include mostly whole grains. Desserts and added sugars should be used sparingly.   Include small amounts of heart-healthy fats. These types of fats include nuts, oils, and tub margarine. Limit saturated and trans fats. These fats have been shown to be harmful in the body.  CHOOSING FOODS   The following food groups are based on a 2000 calorie diet. See your Registered Dietitian for individual calorie needs. Grains and Grain Products (6 to 8 servings daily)  Eat More Often: Whole-wheat bread, brown rice, whole-grain or wheat pasta, quinoa, popcorn without added fat or salt (air popped).   Eat Less Often: White bread, white pasta, white rice, cornbread.  Vegetables (4 to 5 servings daily)  Eat More Often: Fresh, frozen, and canned vegetables. Vegetables may be raw, steamed, roasted, or grilled with a  minimal amount of fat.   Eat Less Often/Avoid: Creamed or fried vegetables. Vegetables in a cheese sauce.  Fruit (4 to 5 servings daily)  Eat More Often: All fresh, canned (in natural juice), or frozen fruits. Dried fruits without added sugar. One hundred percent fruit juice ( cup [237 mL] daily).   Eat Less Often: Dried fruits with added sugar. Canned fruit in light or heavy syrup.  Lean Meats, Fish, and Poultry (2 servings or less daily. One serving is 3 to 4 oz [85-114 g]).  Eat More Often: Ninety percent or leaner ground beef, tenderloin, sirloin. Round cuts of beef, chicken breast, turkey breast. All fish. Grill, bake, or broil your meat. Nothing should be fried.   Eat Less Often/Avoid: Fatty cuts of meat, turkey, or chicken leg, thigh, or wing. Fried cuts of meat or fish.  Dairy (2 to 3 servings)  Eat More Often: Low-fat or fat-free milk, low-fat plain or light yogurt, reduced-fat or part-skim cheese.   Eat Less Often/Avoid: Milk (whole, 2%, skim, or chocolate). Whole milk yogurt. Full-fat cheeses.  Nuts, Seeds, and Legumes (4 to 5 servings per week)  Eat More Often: All without added salt.   Eat Less Often/Avoid: Salted nuts and seeds, canned beans with added salt.  Fats and Sweets (limited)  Eat More Often: Vegetable oils, tub margarines without trans fats, sugar-free gelatin. Mayonnaise and salad dressings.   Eat Less Often/Avoid: Coconut oils, palm oils, butter, stick margarine, cream, half and half, cookies, candy, pie.  FOR MORE INFORMATION The Dash Diet Eating Plan: www.dashdiet.org Document Released:   07/31/2011 Document Reviewed: 07/21/2011 [employer] Patient Information 2012 [employer]. 

## 2012-05-12 ENCOUNTER — Other Ambulatory Visit: Payer: Self-pay | Admitting: Internal Medicine

## 2012-05-12 NOTE — Telephone Encounter (Signed)
ok 

## 2012-05-12 NOTE — Telephone Encounter (Signed)
Called in.

## 2012-05-12 NOTE — Telephone Encounter (Signed)
last seen 04/29/12  4 mos rov  Last written 03/15/12 # 60 1Rf Please advise

## 2012-06-14 ENCOUNTER — Emergency Department (HOSPITAL_BASED_OUTPATIENT_CLINIC_OR_DEPARTMENT_OTHER): Payer: No Typology Code available for payment source

## 2012-06-14 ENCOUNTER — Encounter (HOSPITAL_BASED_OUTPATIENT_CLINIC_OR_DEPARTMENT_OTHER): Payer: Self-pay | Admitting: Emergency Medicine

## 2012-06-14 ENCOUNTER — Emergency Department (HOSPITAL_BASED_OUTPATIENT_CLINIC_OR_DEPARTMENT_OTHER)
Admission: EM | Admit: 2012-06-14 | Discharge: 2012-06-14 | Disposition: A | Discharge status: Home / Self Care | Payer: No Typology Code available for payment source | Attending: Emergency Medicine | Admitting: Emergency Medicine

## 2012-06-14 DIAGNOSIS — J309 Allergic rhinitis, unspecified: Secondary | ICD-10-CM | POA: Insufficient documentation

## 2012-06-14 DIAGNOSIS — G8929 Other chronic pain: Secondary | ICD-10-CM | POA: Insufficient documentation

## 2012-06-14 DIAGNOSIS — S63501A Unspecified sprain of right wrist, initial encounter: Secondary | ICD-10-CM

## 2012-06-14 DIAGNOSIS — Y9389 Activity, other specified: Secondary | ICD-10-CM | POA: Insufficient documentation

## 2012-06-14 DIAGNOSIS — Z87891 Personal history of nicotine dependence: Secondary | ICD-10-CM | POA: Insufficient documentation

## 2012-06-14 DIAGNOSIS — S63509A Unspecified sprain of unspecified wrist, initial encounter: Secondary | ICD-10-CM | POA: Insufficient documentation

## 2012-06-14 DIAGNOSIS — Z79899 Other long term (current) drug therapy: Secondary | ICD-10-CM | POA: Insufficient documentation

## 2012-06-14 DIAGNOSIS — N62 Hypertrophy of breast: Secondary | ICD-10-CM | POA: Insufficient documentation

## 2012-06-14 DIAGNOSIS — Y92410 Unspecified street and highway as the place of occurrence of the external cause: Secondary | ICD-10-CM | POA: Insufficient documentation

## 2012-06-14 DIAGNOSIS — F411 Generalized anxiety disorder: Secondary | ICD-10-CM | POA: Insufficient documentation
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
## 2012-06-24 ENCOUNTER — Ambulatory Visit (INDEPENDENT_AMBULATORY_CARE_PROVIDER_SITE_OTHER): Payer: Managed Care, Other (non HMO) | Admitting: Internal Medicine

## 2012-06-24 ENCOUNTER — Encounter: Payer: Self-pay | Admitting: Internal Medicine

## 2012-06-24 VITALS — BP 114/88 | Temp 98.4°F | Wt 213.0 lb

## 2012-06-24 DIAGNOSIS — S63509A Unspecified sprain of unspecified wrist, initial encounter: Secondary | ICD-10-CM

## 2012-06-24 DIAGNOSIS — S66911A Strain of unspecified muscle, fascia and tendon at wrist and hand level, right hand, initial encounter: Secondary | ICD-10-CM

## 2012-06-24 DIAGNOSIS — F411 Generalized anxiety disorder: Secondary | ICD-10-CM

## 2012-06-24 DIAGNOSIS — M25519 Pain in unspecified shoulder: Secondary | ICD-10-CM

## 2012-06-24 MED ORDER — HYDROCODONE-ACETAMINOPHEN 5-500 MG PO TABS: 1.0000 | ORAL_TABLET | Freq: Four times a day (QID) | ORAL | Status: DC | PRN

## 2012-06-24 NOTE — Patient Instructions (Signed)
Orthopedic followup if needed  Call or return to clinic prn if these symptoms worsen or fail to improve as anticipated.
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
## 2012-08-19 ENCOUNTER — Other Ambulatory Visit: Payer: Self-pay | Admitting: Internal Medicine

## 2012-08-19 MED ORDER — HYDROCODONE-ACETAMINOPHEN 5-325 MG PO TABS: 1.0000 | ORAL_TABLET | Freq: Four times a day (QID) | ORAL | Status: DC | PRN

## 2012-09-17 ENCOUNTER — Other Ambulatory Visit: Payer: Self-pay | Admitting: Internal Medicine

## 2012-10-18 ENCOUNTER — Other Ambulatory Visit: Payer: Self-pay | Admitting: Internal Medicine

## 2012-10-20 ENCOUNTER — Telehealth: Payer: Self-pay | Admitting: Internal Medicine

## 2012-10-20 NOTE — Telephone Encounter (Signed)
Called [facility] and spoke to pharmacist Dena Billet he said they never received Rx for Hydrocodone. Verbal order given again for Hydrocodone-Acetaminophen 5/325 mg one tablet by mouth every 6 hours PRN for pain, disp #60, refill 0. Dena Billet verbalized understanding.

## 2012-10-20 NOTE — Telephone Encounter (Signed)
Please send new script or verbal to [facility] on [address], [address], [place]. They never received the script for  HYDROcodone-acetaminophen ([place]CO/VICODIN) 5-325 MG per tablet. Thank you.

## 2012-11-19 ENCOUNTER — Other Ambulatory Visit: Payer: Self-pay | Admitting: Internal Medicine

## 2012-12-27 ENCOUNTER — Ambulatory Visit (INDEPENDENT_AMBULATORY_CARE_PROVIDER_SITE_OTHER): Payer: Managed Care, Other (non HMO) | Admitting: Internal Medicine

## 2012-12-27 ENCOUNTER — Encounter: Payer: Self-pay | Admitting: Internal Medicine

## 2012-12-27 VITALS — BP 146/90 | HR 87 | Temp 98.5°F | Resp 18 | Wt 215.0 lb

## 2012-12-27 DIAGNOSIS — M25512 Pain in left shoulder: Secondary | ICD-10-CM

## 2012-12-27 DIAGNOSIS — M25519 Pain in unspecified shoulder: Secondary | ICD-10-CM

## 2012-12-27 DIAGNOSIS — J309 Allergic rhinitis, unspecified: Secondary | ICD-10-CM

## 2012-12-27 DIAGNOSIS — F411 Generalized anxiety disorder: Secondary | ICD-10-CM

## 2012-12-27 MED ORDER — ALPRAZOLAM 0.5 MG PO TABS: ORAL_TABLET | ORAL | Status: DC

## 2012-12-27 MED ORDER — HYDROCODONE-ACETAMINOPHEN 5-325 MG PO TABS: ORAL_TABLET | ORAL | Status: DC

## 2012-12-27 MED ORDER — SERTRALINE HCL 50 MG PO TABS: 50.0000 mg | ORAL_TABLET | Freq: Every day | ORAL | Status: DC

## 2012-12-27 NOTE — Progress Notes (Signed)
Subjective:    Patient ID: Shaun Barron, male    DOB: 15-Nov-1975, 37 y.o.   MRN: [id number]  HPI  37 year old patient who is seen today for his six-month followup. He has a history chronic left shoulder pain. His allergic rhinitis and chronic anxiety. In general doing recently well today. He has had some job related stress but will be switching jobs within the next 2 or 3 weeks. No new concerns or complaints. Anxiety disorder seems fairly stable.  BP Readings from Last 3 Encounters:  12/27/12 146/90  06/24/12 114/88  06/14/12 162/89   Past Medical History  Diagnosis Date  . ALLERGIC RHINITIS 10/09/2008  . ANXIETY 10/09/2008  . GYNECOMASTIA 04/16/2010  . SHOULDER PAIN, LEFT, CHRONIC 10/19/2008    History   Social History  . Marital Status: Married    Spouse Name: N/A    Number of Children: N/A  . 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 Smoking status: Former Smoker    Quit date: 09/25/2005  . Smokeless tobacco: Never Used  . Alcohol Use: Yes     Comment: rarely  . Drug Use: No  . Sexually Active: Not on file   Other Topics Concern  . Not on file   Social History Narrative   One step sister and one step brother, both in good health      70 year old son, 32-year old daughter    Past Surgical History  Procedure Laterality Date  . Lasik      Family History  Problem Relation Age of Onset  . Coronary artery disease Father     bilat carotid artery stenosis:s/p CABG  . Cancer Mother     hx multiple myeloma-status post stem transpantation July 2008 and presently in remission    No Known Allergies  Current Outpatient Prescriptions on File Prior to Visit  Medication Sig Dispense Refill  . Azelastine HCl (ASTEPRO) 0.15 % SOLN Place 1 application into the nose daily.  5 mL  6  . fexofenadine-pseudoephedrine (ALLEGRA-D) 60-120 MG per tablet Take 1 tablet by mouth daily as needed.  60 tablet  3  .  fluticasone (FLONASE) 50 MCG/ACT nasal spray Place 2 sprays into the nose daily.  16 g  6   No current facility-administered medications on file prior to visit.    BP 146/90  Pulse 87  Temp(Src) 98.5 F (36.9 C) (Oral)  Resp 18  Wt 215 lb (97.523 kg)  BMI 33.67 kg/m2  SpO2 96%    Review of Systems  Constitutional: Negative for fever, chills, appetite change and fatigue.  HENT: Negative for hearing loss, ear pain, congestion, sore throat, trouble swallowing, neck stiffness, dental problem, voice change and tinnitus.   Eyes: Negative for pain, discharge and visual disturbance.  Respiratory: Negative for cough, chest tightness, wheezing and stridor.   Cardiovascular: Negative for chest pain, palpitations and leg swelling.  Gastrointestinal: Negative for nausea, vomiting, abdominal pain, diarrhea, constipation, blood in stool and abdominal distention.  Genitourinary: Negative for urgency, hematuria, flank pain, discharge, difficulty urinating and genital sores.  Musculoskeletal: Negative for myalgias, back pain, joint swelling, arthralgias and gait problem.  Skin: Negative for rash.  Neurological: Negative for dizziness, syncope, speech difficulty, weakness, numbness and headaches.  Hematological: Negative for adenopathy. Does not bruise/bleed easily.  Psychiatric/Behavioral: Positive for behavioral problems. Negative for dysphoric mood. The patient is nervous/anxious.        Objective:  Physical Exam  Constitutional: He is oriented to person, place, and time. He appears well-developed.  Overweight Blood pressure 140/92  HENT:  Head: Normocephalic.  Right Ear: External ear normal.  Left Ear: External ear normal.  Eyes: Conjunctivae and EOM are normal.  Neck: Normal range of motion.  Cardiovascular: Normal rate and normal heart sounds.   Pulmonary/Chest: Breath sounds normal.  Abdominal: Bowel sounds are normal.  Musculoskeletal: Normal range of motion. He exhibits no edema  and no tenderness.  Neurological: He is alert and oriented to person, place, and time.  Psychiatric: He has a normal mood and affect. His behavior is normal.          Assessment & Plan:   Anxiety disorder stable Borderline hypertension. Weight loss and low-salt diet recommended. Home blood pressure monitoring recommended. Decongestants may be a factor with his mildly elevated blood pressure today

## 2012-12-27 NOTE — Patient Instructions (Signed)
Limit your sodium (Salt) intake    It is important that you exercise regularly, at least 20 minutes 3 to 4 times per week.  If you develop chest pain or shortness of breath seek  medical attention.  You need to lose weight.  Consider a lower calorie diet and regular exercise. 

## 2013-01-12 ENCOUNTER — Telehealth: Payer: Self-pay | Admitting: Internal Medicine

## 2013-01-12 NOTE — Telephone Encounter (Signed)
Pt is going on a deep sea diving excursion on Friday 01/14/13. He is calling to request Scopolamine patch be prescribed for him to [place] on [address].

## 2013-01-13 MED ORDER — SCOPOLAMINE 1 MG/3DAYS TD PT72: 1.0000 | MEDICATED_PATCH | TRANSDERMAL | Status: DC

## 2013-01-13 NOTE — Telephone Encounter (Signed)
Pt notified Rx for Scopolamine patch was sent to pharmacy.

## 2013-01-13 NOTE — Telephone Encounter (Signed)
Okay 

## 2013-02-21 ENCOUNTER — Other Ambulatory Visit: Payer: Self-pay | Admitting: Internal Medicine

## 2013-02-23 NOTE — Telephone Encounter (Signed)
ok 

## 2013-03-28 ENCOUNTER — Other Ambulatory Visit: Payer: Self-pay | Admitting: Internal Medicine

## 2013-04-28 ENCOUNTER — Other Ambulatory Visit: Payer: Self-pay | Admitting: Internal Medicine

## 2013-05-27 ENCOUNTER — Other Ambulatory Visit: Payer: Self-pay | Admitting: Internal Medicine

## 2013-06-22 ENCOUNTER — Ambulatory Visit: Payer: Managed Care, Other (non HMO) | Admitting: Family Medicine

## 2013-06-22 ENCOUNTER — Encounter: Payer: Self-pay | Admitting: Family Medicine

## 2013-06-22 ENCOUNTER — Telehealth: Payer: Self-pay | Admitting: Internal Medicine

## 2013-06-22 ENCOUNTER — Ambulatory Visit (INDEPENDENT_AMBULATORY_CARE_PROVIDER_SITE_OTHER): Payer: Managed Care, Other (non HMO) | Admitting: Family Medicine

## 2013-06-22 VITALS — BP 140/98 | Temp 97.7°F | Wt 224.0 lb

## 2013-06-22 DIAGNOSIS — R300 Dysuria: Secondary | ICD-10-CM

## 2013-06-22 DIAGNOSIS — N410 Acute prostatitis: Secondary | ICD-10-CM

## 2013-06-22 LAB — POCT URINALYSIS DIPSTICK
Bilirubin, UA: NEGATIVE
Glucose, UA: NEGATIVE
Leukocytes, UA: NEGATIVE
Nitrite, UA: NEGATIVE

## 2013-06-22 MED ORDER — SULFAMETHOXAZOLE-TMP DS 800-160 MG PO TABS: 1.0000 | ORAL_TABLET | Freq: Two times a day (BID) | ORAL | Status: DC

## 2013-06-22 NOTE — Telephone Encounter (Signed)
Patient Information:  Caller Name: Dellas  Phone: [phone]  Patient: Shaun Barron, Shaun Barron  Gender: Male  DOB: 12-Feb-1976  Age: 37 Years  PCP: Eleonore Chiquito [facility])  Office Follow Up:  Does the office need to follow up with this patient?: No  Instructions For The Office: N/A   Symptoms  Reason For Call & Symptoms: Dysuria, frequency and hematuria.  Denies flank or low back pain  Reviewed Health History In EMR: Yes  Reviewed Medications In EMR: Yes  Reviewed Allergies In EMR: Yes  Reviewed Surgeries / Procedures: Yes  Date of Onset of Symptoms: 06/22/2013  Treatments Tried: Hydrocodone  Treatments Tried Worked: Yes  Guideline(s) Used:  Urination Pain - Male  Disposition Per Guideline:   See Today in Office  Reason For Disposition Reached:   All other males with painful urination, or patient wants to be seen  Advice Given:  Fluids  : Drink extra fluids (Reason: to produce a dilute, nonirritating urine).  Call Back If:  You become worse.  Patient Will Follow Care Advice:  YES  Appointment Scheduled:  06/22/2013 09:15:00 Appointment Scheduled Provider:  Kelle Darting [facility])

## 2013-06-22 NOTE — Progress Notes (Signed)
  Subjective:    Patient ID: Shaun Barron, male    DOB: Oct 12, 1975, 37 y.o.   MRN: [id number]  HPI Shaun Barron is a 37 year old male who comes in today for evaluation of dysuria and hematuria times one day  He states he was well until this morning when he began to urinate and a burning noticed blood in his urine. He said no fever chills or back pain. He did have an episode of prostatitis 11 years ago  No history of trauma   Review of Systems Review of systems otherwise negative    Objective:   Physical Exam  Well-developed well-nourished male no acute distress examination of the urine shows no bacteria large white cells 6. Vital signs stable he is afebrile      Assessment & Plan:  Acute prostatitis,,,,,,,,,, doxycycline 100 twice a day for 3 weeks

## 2013-06-22 NOTE — Patient Instructions (Signed)
Septra DS,,,,,,,,,,, one twice daily for 1 month  Drink lots of water  Return when necessary

## 2013-06-22 NOTE — Telephone Encounter (Signed)
FYI

## 2013-06-29 ENCOUNTER — Encounter: Payer: Self-pay | Admitting: Internal Medicine

## 2013-06-29 ENCOUNTER — Ambulatory Visit (INDEPENDENT_AMBULATORY_CARE_PROVIDER_SITE_OTHER): Payer: Managed Care, Other (non HMO) | Admitting: Internal Medicine

## 2013-06-29 VITALS — BP 150/90 | HR 92 | Temp 98.1°F | Resp 20 | Wt 221.0 lb

## 2013-06-29 DIAGNOSIS — Z23 Encounter for immunization: Secondary | ICD-10-CM

## 2013-06-29 DIAGNOSIS — F411 Generalized anxiety disorder: Secondary | ICD-10-CM

## 2013-06-29 DIAGNOSIS — J309 Allergic rhinitis, unspecified: Secondary | ICD-10-CM

## 2013-06-29 MED ORDER — ALPRAZOLAM 0.5 MG PO TABS: ORAL_TABLET | ORAL | Status: DC

## 2013-06-29 MED ORDER — HYDROCODONE-ACETAMINOPHEN 5-325 MG PO TABS: ORAL_TABLET | ORAL | Status: DC

## 2013-06-29 NOTE — Progress Notes (Signed)
Pre-visit discussion using our clinic review tool. No additional management support is needed unless otherwise documented below in the visit note.  

## 2013-06-29 NOTE — Progress Notes (Signed)
Subjective:    Patient ID: Shaun Barron, male    DOB: 23-Jan-1976, 37 y.o.   MRN: [id number]  HPI Pre-visit discussion using our clinic review tool. No additional management support is needed unless otherwise documented below in the visit note.   37 year old patient who has a history of allergic rhinitis and anxiety disorder. He is seen today for his biannual followup. No concerns or complaints. In general doing quite well. He was seen last week for acute prostatitis and is completing antibiotic therapy. Doing well today. His urinary symptoms have resolved  Past Medical History  Diagnosis Date  . ALLERGIC RHINITIS 10/09/2008  . ANXIETY 10/09/2008  . GYNECOMASTIA 04/16/2010  . SHOULDER PAIN, LEFT, CHRONIC 10/19/2008    History   Social History  . Marital Status: Married    Spouse Name: N/A    Number of Children: N/A  . 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 Smoking status: Former Smoker    Quit date: 09/25/2005  . Smokeless tobacco: Never Used  . Alcohol Use: Yes     Comment: rarely  . Drug Use: No  . Sexual Activity: Not on file   Other Topics Concern  . Not on file   Social History Narrative   One step sister and one step brother, both in good health      31 year old son, 79-year old daughter    Past Surgical History  Procedure Laterality Date  . Lasik      Family History  Problem Relation Age of Onset  . Coronary artery disease Father     bilat carotid artery stenosis:s/p CABG  . Cancer Mother     hx multiple myeloma-status post stem transpantation July 2008 and presently in remission    No Known Allergies  Current Outpatient Prescriptions on File Prior to Visit  Medication Sig Dispense Refill  . ALPRAZolam (XANAX) 0.5 MG tablet TAKE ONE TABLET BY MOUTH THREE TIMES DAILY AS NEEDED FOR SLEEP  60 tablet  1  . fexofenadine-pseudoephedrine (ALLEGRA-D) 60-120 MG per tablet Take 1 tablet by  mouth daily as needed.  60 tablet  3  . HYDROcodone-acetaminophen (NORCO/VICODIN) 5-325 MG per tablet TAKE ONE TABLET BY MOUTH EVERY 6 HOURS AS NEEDED FOR PAIN  60 tablet  0  . sertraline (ZOLOFT) 50 MG tablet Take 1 tablet (50 mg total) by mouth daily.  90 tablet  3  . sulfamethoxazole-trimethoprim (BACTRIM DS) 800-160 MG per tablet Take 1 tablet by mouth 2 (two) times daily.  60 tablet  0   No current facility-administered medications on file prior to visit.    BP 150/90  Pulse 92  Temp(Src) 98.1 F (36.7 C) (Oral)  Resp 20  Wt 221 lb (100.245 kg)  SpO2 95%      Review of Systems  Constitutional: Negative for fever, chills, appetite change and fatigue.  HENT: Negative for congestion, dental problem, ear pain, hearing loss, sore throat, tinnitus, trouble swallowing and voice change.   Eyes: Negative for pain, discharge and visual disturbance.  Respiratory: Negative for cough, chest tightness, wheezing and stridor.   Cardiovascular: Negative for chest pain, palpitations and leg swelling.  Gastrointestinal: Negative for nausea, vomiting, abdominal pain, diarrhea, constipation, blood in stool and abdominal distention.  Genitourinary: Negative for urgency, hematuria, flank pain, discharge, difficulty urinating and genital sores.  Musculoskeletal: Negative for arthralgias, back pain, gait problem, joint swelling, myalgias and neck stiffness.  Skin:  Negative for rash.  Neurological: Negative for dizziness, syncope, speech difficulty, weakness, numbness and headaches.  Hematological: Negative for adenopathy. Does not bruise/bleed easily.  Psychiatric/Behavioral: Negative for behavioral problems and dysphoric mood. The patient is not nervous/anxious.        Objective:   Physical Exam  Constitutional: He is oriented to person, place, and time. He appears well-developed.  Overweight. Weight 221 Repeat blood pressure 130/80  HENT:  Head: Normocephalic.  Right Ear: External ear  normal.  Left Ear: External ear normal.  Eyes: Conjunctivae and EOM are normal.  Neck: Normal range of motion.  Cardiovascular: Normal rate and normal heart sounds.   Pulmonary/Chest: Breath sounds normal.  Abdominal: Bowel sounds are normal.  Musculoskeletal: Normal range of motion. He exhibits no edema and no tenderness.  Neurological: He is alert and oriented to person, place, and time.  Psychiatric: He has a normal mood and affect. His behavior is normal.          Assessment & Plan:   Limit your sodium (Salt) intake  You need to lose weight.  Consider a lower calorie diet and regular exercise.    It is important that you exercise regularly, at least 20 minutes 3 to 4 times per week.  If you develop chest pain or shortness of breath seek  medical attention.  Return in 6 months for follow-up

## 2013-08-29 ENCOUNTER — Telehealth: Payer: Self-pay | Admitting: Internal Medicine

## 2013-08-29 MED ORDER — HYDROCODONE-ACETAMINOPHEN 5-325 MG PO TABS: ORAL_TABLET | ORAL | Status: DC

## 2013-08-29 NOTE — Telephone Encounter (Signed)
Left detailed message Rx ready for pickup will be at the front desk. Rx printed and signed, put at front desk for pickup.

## 2013-08-29 NOTE — Telephone Encounter (Signed)
Patient called requesting a refill on HYDROcodone-acetaminophen (NORCO/VICODIN) 5-325 MG per tablet  °Please advise  °

## 2013-10-19 ENCOUNTER — Telehealth: Payer: Self-pay | Admitting: Internal Medicine

## 2013-10-19 MED ORDER — HYDROCODONE-ACETAMINOPHEN 5-325 MG PO TABS: ORAL_TABLET | ORAL | Status: DC

## 2013-10-19 NOTE — Telephone Encounter (Signed)
Pt request HYDROcodone-acetaminophen (NORCO/VICODIN) 5-325 MG per tablet °  °

## 2013-10-19 NOTE — Telephone Encounter (Signed)
Pt notified Rx ready for pickup, will be at the front desk. Rx printed and signed. 
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
## 2013-11-29 ENCOUNTER — Telehealth: Payer: Self-pay | Admitting: Internal Medicine

## 2013-11-29 MED ORDER — HYDROCODONE-ACETAMINOPHEN 5-325 MG PO TABS: ORAL_TABLET | ORAL | Status: DC

## 2013-11-29 NOTE — Telephone Encounter (Signed)
Pt needs new rx hydrocodone °

## 2013-11-29 NOTE — Telephone Encounter (Signed)
Left detailed message Rx ready for pickup, will be at the front desk. Rx printed and signed. 

## 2013-12-08 ENCOUNTER — Other Ambulatory Visit: Payer: Self-pay | Admitting: Internal Medicine

## 2013-12-27 ENCOUNTER — Encounter: Payer: Self-pay | Admitting: Internal Medicine

## 2013-12-27 ENCOUNTER — Ambulatory Visit (INDEPENDENT_AMBULATORY_CARE_PROVIDER_SITE_OTHER): Payer: BC Managed Care – PPO | Admitting: Internal Medicine

## 2013-12-27 VITALS — BP 130/80 | HR 80 | Temp 98.4°F | Resp 20 | Ht 67.0 in | Wt 229.0 lb

## 2013-12-27 DIAGNOSIS — J309 Allergic rhinitis, unspecified: Secondary | ICD-10-CM

## 2013-12-27 DIAGNOSIS — M25519 Pain in unspecified shoulder: Secondary | ICD-10-CM

## 2013-12-27 DIAGNOSIS — F411 Generalized anxiety disorder: Secondary | ICD-10-CM

## 2013-12-27 MED ORDER — SERTRALINE HCL 50 MG PO TABS: 50.0000 mg | ORAL_TABLET | Freq: Every day | ORAL | Status: DC

## 2013-12-27 MED ORDER — ALPRAZOLAM 0.5 MG PO TABS: ORAL_TABLET | ORAL | Status: DC

## 2013-12-27 MED ORDER — HYDROCODONE-ACETAMINOPHEN 5-325 MG PO TABS: ORAL_TABLET | ORAL | Status: DC

## 2013-12-27 NOTE — Progress Notes (Signed)
Pre-visit discussion using our clinic review tool. No additional management support is needed unless otherwise documented below in the visit note.  

## 2013-12-27 NOTE — Progress Notes (Signed)
Subjective:    Patient ID: Shaun Barron, male    DOB: [birth year], 38 y.o.   MRN: [id number]  HPI  38 year old patient who is in today for his monthly followup.  He has a history of allergic rhinitis, which has been stable on nonsedating antihistamines and nasal steroids.  He has a history of anxiety disorder, which has been well managed on sertraline.  He also uses alprazolam, usually once daily. He has a history of chronic left shoulder pain related to prior, and repeated dislocations.  He requires analgesics 3-4 times per week  Past Medical History  Diagnosis Date  . ALLERGIC RHINITIS 10/09/2008  . ANXIETY 10/09/2008  . GYNECOMASTIA 04/16/2010  . SHOULDER PAIN, LEFT, CHRONIC 10/19/2008    History   Social History  . Marital Status: Married    Spouse Name: N/A    Number of Children: N/A  . 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 Smoking status: Former Smoker    Quit date: 09/25/2005  . Smokeless tobacco: Never Used  . Alcohol Use: Yes     Comment: rarely  . Drug Use: No  . Sexual Activity: Not on file   Other Topics Concern  . Not on file   Social History Narrative   One step sister and one step brother, both in good health      44 year old son, 79-year old daughter    Past Surgical History  Procedure Laterality Date  . Lasik      Family History  Problem Relation Age of Onset  . Coronary artery disease Father     bilat carotid artery stenosis:s/p CABG  . Cancer Mother     hx multiple myeloma-status post stem transpantation July 2008 and presently in remission    No Known Allergies  Current Outpatient Prescriptions on File Prior to Visit  Medication Sig Dispense Refill  . fexofenadine-pseudoephedrine (ALLEGRA-D) 60-120 MG per tablet Take 1 tablet by mouth daily as needed.  60 tablet  3  . Triamcinolone Acetonide (NASACORT AQ NA) Place 1 spray into the nose daily. OTC       No current  facility-administered medications on file prior to visit.    BP 130/80  Pulse 80  Temp(Src) 98.4 F (36.9 C) (Oral)  Resp 20  Ht $R'5\' 7"'hd$  (1.702 m)  Wt 229 lb (103.874 kg)  BMI 35.86 kg/m2  SpO2 95%       Review of Systems  Constitutional: Negative for fever, chills, appetite change and fatigue.  HENT: Negative for congestion, dental problem, ear pain, hearing loss, sore throat, tinnitus, trouble swallowing and voice change.   Eyes: Negative for pain, discharge and visual disturbance.  Respiratory: Negative for cough, chest tightness, wheezing and stridor.   Cardiovascular: Negative for chest pain, palpitations and leg swelling.  Gastrointestinal: Negative for nausea, vomiting, abdominal pain, diarrhea, constipation, blood in stool and abdominal distention.  Genitourinary: Negative for urgency, hematuria, flank pain, discharge, difficulty urinating and genital sores.  Musculoskeletal: Positive for arthralgias. Negative for back pain, gait problem, joint swelling, myalgias and neck stiffness.  Skin: Negative for rash.  Neurological: Negative for dizziness, syncope, speech difficulty, weakness, numbness and headaches.  Hematological: Negative for adenopathy. Does not bruise/bleed easily.  Psychiatric/Behavioral: Negative for behavioral problems and dysphoric mood. The patient is not nervous/anxious.        Objective:   Physical Exam  Constitutional: He is oriented to person, place,  and time. He appears well-developed.  Blood pressure 130/80  HENT:  Head: Normocephalic.  Right Ear: External ear normal.  Left Ear: External ear normal.  Pharyngeal crowding  Eyes: Conjunctivae and EOM are normal.  Neck: Normal range of motion.  Cardiovascular: Normal rate and normal heart sounds.   Pulmonary/Chest: Breath sounds normal.  Abdominal: Bowel sounds are normal.  Musculoskeletal: Normal range of motion. He exhibits no edema and no tenderness.  Neurological: He is alert and oriented  to person, place, and time.  Psychiatric: He has a normal mood and affect. His behavior is normal.          Assessment & Plan:   Anxiety disorder.  We'll continue sertraline and when necessary alprazolam.  Refills dispensed Allergic rhinitis, stable Exogenous obesity.  Weight loss encouraged  Recheck 6 months

## 2013-12-27 NOTE — Patient Instructions (Signed)
It is important that you exercise regularly, at least 20 minutes 3 to 4 times per week.  If you develop chest pain or shortness of breath seek  medical attention.  You need to lose weight.  Consider a lower calorie diet and regular exercise.  Return in 6 months for follow-up  

## 2014-02-14 ENCOUNTER — Telehealth: Payer: Self-pay | Admitting: Internal Medicine

## 2014-02-14 MED ORDER — HYDROCODONE-ACETAMINOPHEN 5-325 MG PO TABS: ORAL_TABLET | ORAL | Status: DC

## 2014-02-14 NOTE — Telephone Encounter (Signed)
Pt is needing new rx HYDROcodone-acetaminophen (NORCO/VICODIN) 5-325 MG per tablet, please call when available for pick up. ° °

## 2014-02-14 NOTE — Telephone Encounter (Signed)
Left detailed message Rx ready for pickup. Rx printed and signed. 

## 2014-03-27 ENCOUNTER — Telehealth: Payer: Self-pay | Admitting: Internal Medicine

## 2014-03-27 MED ORDER — HYDROCODONE-ACETAMINOPHEN 5-325 MG PO TABS: ORAL_TABLET | ORAL | Status: DC

## 2014-03-27 NOTE — Telephone Encounter (Signed)
Pt request refill HYDROcodone-acetaminophen (NORCO/VICODIN) 5-325 MG per tablet °

## 2014-03-27 NOTE — Telephone Encounter (Signed)
Pt notified Rx ready for pickup. Rx printed and signed.  

## 2014-05-11 ENCOUNTER — Telehealth: Payer: Self-pay | Admitting: Internal Medicine

## 2014-05-11 MED ORDER — HYDROCODONE-ACETAMINOPHEN 5-325 MG PO TABS: ORAL_TABLET | ORAL | Status: DC

## 2014-05-11 NOTE — Telephone Encounter (Signed)
Pt requesting refill of HYDROcodone-acetaminophen (NORCO/VICODIN) 5-325 MG per tablet 

## 2014-05-11 NOTE — Telephone Encounter (Signed)
Left detailed message Rx ready for pickup. Rx printed and signed. 

## 2014-06-22 ENCOUNTER — Other Ambulatory Visit (INDEPENDENT_AMBULATORY_CARE_PROVIDER_SITE_OTHER): Payer: BC Managed Care – PPO

## 2014-06-22 DIAGNOSIS — Z0000 Encounter for general adult medical examination without abnormal findings: Secondary | ICD-10-CM

## 2014-06-22 LAB — CBC WITH DIFFERENTIAL/PLATELET
BASOS ABS: 0 10*3/uL (ref 0.0–0.1)
Basophils Relative: 0.6 % (ref 0.0–3.0)
EOS ABS: 0.2 10*3/uL (ref 0.0–0.7)
Eosinophils Relative: 2.2 % (ref 0.0–5.0)
HCT: 44.3 % (ref 39.0–52.0)
HEMOGLOBIN: 14.7 g/dL (ref 13.0–17.0)
LYMPHS ABS: 3 10*3/uL (ref 0.7–4.0)
LYMPHS PCT: 38.7 % (ref 12.0–46.0)
MCHC: 33.3 g/dL (ref 30.0–36.0)
MCV: 92.1 fl (ref 78.0–100.0)
MONOS PCT: 5.6 % (ref 3.0–12.0)
Monocytes Absolute: 0.4 10*3/uL (ref 0.1–1.0)
NEUTROS ABS: 4.1 10*3/uL (ref 1.4–7.7)
Neutrophils Relative %: 52.9 % (ref 43.0–77.0)
PLATELETS: 310 10*3/uL (ref 150.0–400.0)
RBC: 4.81 Mil/uL (ref 4.22–5.81)
RDW: 12.3 % (ref 11.5–15.5)
WBC: 7.8 10*3/uL (ref 4.0–10.5)

## 2014-06-22 LAB — POCT URINALYSIS DIPSTICK
BILIRUBIN UA: NEGATIVE
GLUCOSE UA: NEGATIVE
Ketones, UA: NEGATIVE
Leukocytes, UA: NEGATIVE
Nitrite, UA: NEGATIVE
PH UA: 6.5
RBC UA: NEGATIVE
SPEC GRAV UA: 1.015
Urobilinogen, UA: 0.2

## 2014-06-22 LAB — HEPATIC FUNCTION PANEL
ALBUMIN: 3.7 g/dL (ref 3.5–5.2)
ALK PHOS: 70 U/L (ref 39–117)
ALT: 51 U/L (ref 0–53)
AST: 29 U/L (ref 0–37)
BILIRUBIN TOTAL: 0.6 mg/dL (ref 0.2–1.2)
Bilirubin, Direct: 0 mg/dL (ref 0.0–0.3)
Total Protein: 7.7 g/dL (ref 6.0–8.3)

## 2014-06-22 LAB — BASIC METABOLIC PANEL
BUN: 10 mg/dL (ref 6–23)
CALCIUM: 9 mg/dL (ref 8.4–10.5)
CO2: 26 meq/L (ref 19–32)
Chloride: 102 mEq/L (ref 96–112)
Creatinine, Ser: 0.7 mg/dL (ref 0.4–1.5)
GFR: 130.42 mL/min (ref >60.00)
GLUCOSE: 106 mg/dL — AB (ref 70–99)
Potassium: 4.2 mEq/L (ref 3.5–5.1)
SODIUM: 136 meq/L (ref 135–145)

## 2014-06-22 LAB — LIPID PANEL
Cholesterol: 141 mg/dL (ref 0–200)
HDL: 48.7 mg/dL (ref >39.00)
LDL Cholesterol: 82 mg/dL (ref 0–99)
NonHDL: 92.3
TRIGLYCERIDES: 51 mg/dL (ref 0.0–149.0)
Total CHOL/HDL Ratio: 3
VLDL: 10.2 mg/dL (ref 0.0–40.0)

## 2014-06-22 LAB — TSH: TSH: 0.89 u[IU]/mL (ref 0.35–4.50)

## 2014-06-29 ENCOUNTER — Encounter: Payer: Self-pay | Admitting: Internal Medicine

## 2014-06-29 ENCOUNTER — Ambulatory Visit (INDEPENDENT_AMBULATORY_CARE_PROVIDER_SITE_OTHER): Payer: BC Managed Care – PPO | Admitting: Internal Medicine

## 2014-06-29 ENCOUNTER — Encounter: Payer: Self-pay | Admitting: *Deleted

## 2014-06-29 VITALS — BP 122/66 | HR 85 | Temp 97.9°F | Resp 20 | Ht 65.75 in | Wt 221.0 lb

## 2014-06-29 DIAGNOSIS — M25511 Pain in right shoulder: Secondary | ICD-10-CM

## 2014-06-29 DIAGNOSIS — Z0000 Encounter for general adult medical examination without abnormal findings: Secondary | ICD-10-CM

## 2014-06-29 MED ORDER — ALPRAZOLAM 0.5 MG PO TABS: ORAL_TABLET | ORAL | Status: DC

## 2014-06-29 MED ORDER — HYDROCODONE-ACETAMINOPHEN 5-325 MG PO TABS: ORAL_TABLET | ORAL | Status: DC

## 2014-06-29 MED ORDER — SERTRALINE HCL 50 MG PO TABS: 50.0000 mg | ORAL_TABLET | Freq: Every day | ORAL | Status: DC

## 2014-06-29 NOTE — Progress Notes (Signed)
Pre visit review using our clinic review tool, if applicable. No additional management support is needed unless otherwise documented below in the visit note. 

## 2014-06-29 NOTE — Patient Instructions (Addendum)

## 2014-06-29 NOTE — Progress Notes (Signed)
Subjective:    Patient ID: Shaun Barron, male    DOB: 07-31-1976, 38 y.o.   MRN: [id number]  HPI 38 year old patient who is seen today for a wellness exam He has a history of gynecomastia since age 59.  Additional problems include allergic rhinitis, anxiety disorder and chronic intermittent left shoulder pain. Laboratory studies reviewed.  Fasting blood sugar slightly elevated  Family history father age 69, status post CABG, history of adult onset diabetes Mother died age 50 of complications of multiple myeloma A number of stepsiblings  Social history nonsmoker.  Married, 2 children, ages 16 and 1  Past Medical History  Diagnosis Date  . ALLERGIC RHINITIS 10/09/2008  . ANXIETY 10/09/2008  . GYNECOMASTIA 04/16/2010  . SHOULDER PAIN, LEFT, CHRONIC 10/19/2008    History   Social History  . Marital Status: Married    Spouse Name: N/A    Number of Children: N/A  . 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 Smoking status: Former Smoker    Quit date: 09/25/2005  . Smokeless tobacco: Never Used  . Alcohol Use: Yes     Comment: rarely  . Drug Use: No  . Sexual Activity: Not on file   Other Topics Concern  . Not on file   Social History Narrative   One step sister and one step brother, both in good health      59 year old son, 73-year old daughter    Past Surgical History  Procedure Laterality Date  . Lasik      Family History  Problem Relation Age of Onset  . Coronary artery disease Father     bilat carotid artery stenosis:s/p CABG  . Cancer Mother     hx multiple myeloma-status post stem transpantation July 2008 and presently in remission    No Known Allergies  Current Outpatient Prescriptions on File Prior to Visit  Medication Sig Dispense Refill  . fexofenadine-pseudoephedrine (ALLEGRA-D) 60-120 MG per tablet Take 1 tablet by mouth daily as needed. 60 tablet 3  . Triamcinolone Acetonide  (NASACORT AQ NA) Place 1 spray into the nose daily. OTC     No current facility-administered medications on file prior to visit.    BP 122/66 mmHg  Pulse 85  Temp(Src) 97.9 F (36.6 C) (Oral)  Resp 20  Ht 5' 5.75" (1.67 m)  Wt 221 lb (100.245 kg)  BMI 35.94 kg/m2  SpO2 96%       Review of Systems  Constitutional: Negative for fever, chills, activity change, appetite change and fatigue.  HENT: Negative for congestion, dental problem, ear pain, hearing loss, mouth sores, rhinorrhea, sinus pressure, sneezing, tinnitus, trouble swallowing and voice change.   Eyes: Negative for photophobia, pain, redness and visual disturbance.  Respiratory: Negative for apnea, cough, choking, chest tightness, shortness of breath and wheezing.   Cardiovascular: Negative for chest pain, palpitations and leg swelling.  Gastrointestinal: Negative for nausea, vomiting, abdominal pain, diarrhea, constipation, blood in stool, abdominal distention, anal bleeding and rectal pain.  Genitourinary: Negative for dysuria, urgency, frequency, hematuria, flank pain, decreased urine volume, discharge, penile swelling, scrotal swelling, difficulty urinating, genital sores and testicular pain.  Musculoskeletal: Negative for myalgias, back pain, joint swelling, arthralgias, gait problem, neck pain and neck stiffness.       Intermittent left shoulder and left knee pain  Skin: Negative for color change, rash and wound.  Neurological: Negative for dizziness, tremors, seizures, syncope, facial  asymmetry, speech difficulty, weakness, light-headedness, numbness and headaches.  Hematological: Negative for adenopathy. Does not bruise/bleed easily.  Psychiatric/Behavioral: Negative for suicidal ideas, hallucinations, behavioral problems, confusion, sleep disturbance, self-injury, dysphoric mood, decreased concentration and agitation. The patient is not nervous/anxious.        Objective:   Physical Exam  Constitutional: He  appears well-developed and well-nourished.  obese  HENT:  Head: Normocephalic and atraumatic.  Right Ear: External ear normal.  Left Ear: External ear normal.  Nose: Nose normal.  Mouth/Throat: Oropharynx is clear and moist.  Eyes: Conjunctivae and EOM are normal. Pupils are equal, round, and reactive to light. No scleral icterus.  Neck: Normal range of motion. Neck supple. No JVD present. No thyromegaly present.  Cardiovascular: Regular rhythm, normal heart sounds and intact distal pulses.  Exam reveals no gallop and no friction rub.   No murmur heard. Pulmonary/Chest: Effort normal and breath sounds normal. He exhibits no tenderness.  Mild gynecomastia  Abdominal: Soft. Bowel sounds are normal. He exhibits no distension and no mass. There is no tenderness.  Genitourinary: Penis normal.  Musculoskeletal: Normal range of motion. He exhibits no edema or tenderness.  Lymphadenopathy:    He has no cervical adenopathy.  Neurological: He is alert. He has normal reflexes. No cranial nerve deficit. Coordination normal.  Skin: Skin is warm and dry. No rash noted.  Psychiatric: He has a normal mood and affect. His behavior is normal.          Assessment & Plan:   Preventive health exam Allergic rhinitis Obesity/impaired glucose tolerance.  Weight loss encouraged.  He has started a exercise and weight loss program with some success  Medicines updated

## 2014-07-27 ENCOUNTER — Other Ambulatory Visit: Payer: Self-pay | Admitting: Internal Medicine

## 2014-07-27 MED ORDER — HYDROCODONE-ACETAMINOPHEN 5-325 MG PO TABS: ORAL_TABLET | ORAL | Status: DC

## 2014-07-27 NOTE — Telephone Encounter (Signed)
Ok per Dr. Tawanna Coolerodd to fill. Called and spoke with pt and pt is aware rx ready for pick up.

## 2014-07-27 NOTE — Telephone Encounter (Signed)
Pt request refill  °HYDROcodone-acetaminophen (NORCO/VICODIN) 5-325 MG  °

## 2014-08-28 ENCOUNTER — Telehealth: Payer: Self-pay | Admitting: Internal Medicine

## 2014-08-28 MED ORDER — HYDROCODONE-ACETAMINOPHEN 5-325 MG PO TABS: ORAL_TABLET | ORAL | Status: DC

## 2014-08-28 NOTE — Telephone Encounter (Signed)
Pt would like his Hydrocodone refilled please.

## 2014-08-28 NOTE — Telephone Encounter (Signed)
Pt stopped by office to pickup Rx. Rx printed and signed.

## 2014-09-27 ENCOUNTER — Telehealth: Payer: Self-pay | Admitting: Internal Medicine

## 2014-09-27 MED ORDER — HYDROCODONE-ACETAMINOPHEN 5-325 MG PO TABS: ORAL_TABLET | ORAL | Status: DC

## 2014-09-27 NOTE — Telephone Encounter (Signed)
Pt presented to office. Rx printed and signed given to pt.

## 2014-09-27 NOTE — Telephone Encounter (Signed)
Pt needs new rx hydrocodone °

## 2014-10-26 ENCOUNTER — Telehealth: Payer: Self-pay | Admitting: Internal Medicine

## 2014-10-26 MED ORDER — HYDROCODONE-ACETAMINOPHEN 5-325 MG PO TABS: ORAL_TABLET | ORAL | Status: DC

## 2014-10-26 NOTE — Telephone Encounter (Signed)
Left message on personal voicemail Rx ready for pickup. Rx printed and signed. 

## 2014-10-26 NOTE — Telephone Encounter (Signed)
Patient requesting refill on HYDROcodone-acetaminophen (NORCO/VICODIN) 5-325 MG per tablet °

## 2014-11-27 ENCOUNTER — Telehealth: Payer: Self-pay | Admitting: Internal Medicine

## 2014-11-27 MED ORDER — HYDROCODONE-ACETAMINOPHEN 5-325 MG PO TABS: ORAL_TABLET | ORAL | Status: DC

## 2014-11-27 NOTE — Telephone Encounter (Signed)
Pt notified Rx ready for pickup. Rx printed and signed.  

## 2014-11-27 NOTE — Telephone Encounter (Signed)
Pt needs new rx hydrocodone °

## 2014-12-28 ENCOUNTER — Encounter: Payer: Self-pay | Admitting: Internal Medicine

## 2014-12-28 ENCOUNTER — Ambulatory Visit (INDEPENDENT_AMBULATORY_CARE_PROVIDER_SITE_OTHER): Payer: BLUE CROSS/BLUE SHIELD | Admitting: Internal Medicine

## 2014-12-28 VITALS — BP 120/80 | HR 78 | Temp 98.0°F | Resp 20 | Ht 65.75 in | Wt 210.0 lb

## 2014-12-28 DIAGNOSIS — J301 Allergic rhinitis due to pollen: Secondary | ICD-10-CM

## 2014-12-28 DIAGNOSIS — M25512 Pain in left shoulder: Secondary | ICD-10-CM

## 2014-12-28 MED ORDER — ALPRAZOLAM 0.5 MG PO TABS: ORAL_TABLET | ORAL | Status: DC

## 2014-12-28 MED ORDER — HYDROCODONE-ACETAMINOPHEN 5-325 MG PO TABS: ORAL_TABLET | ORAL | Status: DC

## 2014-12-28 NOTE — Patient Instructions (Signed)
It is important that you exercise regularly, at least 20 minutes 3 to 4 times per week.  If you develop chest pain or shortness of breath seek  medical attention.  Return in one year for follow-up  You need to lose weight.  Consider a lower calorie diet and regular exercise.

## 2014-12-28 NOTE — Progress Notes (Signed)
   Subjective:    Patient ID: Shaun MangoCraig B Barron, male    DOB: Nov 18, 1975, 39 y.o.   MRN: [id number]  HPI  Wt Readings from Last 3 Encounters:  12/28/14 210 lb (95.255 kg)  06/29/14 221 lb (100.245 kg)  12/27/13 229 lb (103.874 kg)    Review of Systems     Objective:   Physical Exam        Assessment & Plan:

## 2014-12-28 NOTE — Progress Notes (Signed)
Pre visit review using our clinic review tool, if applicable. No additional management support is needed unless otherwise documented below in the visit note. 

## 2014-12-28 NOTE — Progress Notes (Signed)
Subjective:    Patient ID: Shaun Barron, male    DOB: [birth year], 39 y.o.   MRN: [id number]  HPI 39 year old patient who has a history of chronic left shoulder pain.  He continues to use Vicodin and is limited to 60 per month.  He occasionally uses 1 or 2 per day but often goes a couple days without requiring use.  He also uses Advil with some marginal benefit. He has a history of allergic rhinitis which has been stable There is been some modest weight loss since his last exam 6 months ago He has a history of anxiety, depression, which has been stable  Medications updated  Past Medical History  Diagnosis Date  . ALLERGIC RHINITIS 10/09/2008  . ANXIETY 10/09/2008  . GYNECOMASTIA 04/16/2010  . SHOULDER PAIN, LEFT, CHRONIC 10/19/2008    History   Social History  . Marital Status: Married    Spouse Name: N/A  . Number of Children: N/A  . 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 Smoking status: Former Smoker    Quit date: 09/25/2005  . Smokeless tobacco: Never Used  . Alcohol Use: Yes     Comment: rarely  . Drug Use: No  . Sexual Activity: Not on file   Other Topics Concern  . Not on file   Social History Narrative   One step sister and one step brother, both in good health      11 year old son, 66-year old daughter    Past Surgical History  Procedure Laterality Date  . Lasik      Family History  Problem Relation Age of Onset  . Coronary artery disease Father     bilat carotid artery stenosis:s/p CABG  . Cancer Mother     hx multiple myeloma-status post stem transpantation July 2008 and presently in remission    No Known Allergies  Current Outpatient Prescriptions on File Prior to Visit  Medication Sig Dispense Refill  . fexofenadine-pseudoephedrine (ALLEGRA-D) 60-120 MG per tablet Take 1 tablet by mouth daily as needed. 60 tablet 3  . sertraline (ZOLOFT) 50 MG tablet Take 1 tablet (50 mg  total) by mouth daily. 90 tablet 3  . Triamcinolone Acetonide (NASACORT AQ NA) Place 1 spray into the nose daily. OTC     No current facility-administered medications on file prior to visit.    BP 120/80 mmHg  Pulse 78  Temp(Src) 98 F (36.7 C) (Oral)  Resp 20  Ht 5' 5.75" (1.67 m)  Wt 210 lb (95.255 kg)  BMI 34.16 kg/m2  SpO2 98%      Review of Systems  Constitutional: Negative for fever, chills, appetite change and fatigue.  HENT: Negative for congestion, dental problem, ear pain, hearing loss, sore throat, tinnitus, trouble swallowing and voice change.   Eyes: Negative for pain, discharge and visual disturbance.  Respiratory: Negative for cough, chest tightness, wheezing and stridor.   Cardiovascular: Negative for chest pain, palpitations and leg swelling.  Gastrointestinal: Negative for nausea, vomiting, abdominal pain, diarrhea, constipation, blood in stool and abdominal distention.  Genitourinary: Negative for urgency, hematuria, flank pain, discharge, difficulty urinating and genital sores.  Musculoskeletal: Negative for myalgias, back pain, joint swelling, arthralgias, gait problem and neck stiffness.       Chronic left shoulder pain  Skin: Negative for rash.  Neurological: Negative for dizziness, syncope, speech difficulty, weakness, numbness and headaches.  Hematological: Negative for adenopathy. Does  not bruise/bleed easily.  Psychiatric/Behavioral: Negative for behavioral problems and dysphoric mood. The patient is not nervous/anxious.        Objective:   Physical Exam  Constitutional: He is oriented to person, place, and time. He appears well-developed.  Weight 210  HENT:  Head: Normocephalic.  Right Ear: External ear normal.  Left Ear: External ear normal.  Eyes: Conjunctivae and EOM are normal.  Neck: Normal range of motion.  Cardiovascular: Normal rate and normal heart sounds.   Pulmonary/Chest: Breath sounds normal.  Abdominal: Bowel sounds are  normal.  Musculoskeletal: Normal range of motion. He exhibits no edema or tenderness.  Neurological: He is alert and oriented to person, place, and time.  Psychiatric: He has a normal mood and affect. His behavior is normal.          Assessment & Plan:   Chronic left shoulder pain.  Hydrocodone refilled Anxiety disorder.  We'll continue present regimen of sertraline and when necessary alprazolam Obesity.  Additional weight loss and exercise.  Encouraged.  This summer he will be putting in a outdoor pool  Recheck 6-12 months

## 2015-01-25 ENCOUNTER — Telehealth: Payer: Self-pay | Admitting: Internal Medicine

## 2015-01-25 NOTE — Telephone Encounter (Signed)
Pt needs new hydrocodone °

## 2015-01-26 MED ORDER — HYDROCODONE-ACETAMINOPHEN 5-325 MG PO TABS: ORAL_TABLET | ORAL | Status: DC

## 2015-01-26 NOTE — Telephone Encounter (Signed)
Pt notified Rx ready for pickup. Rx printed and signed.  

## 2015-02-22 ENCOUNTER — Telehealth: Payer: Self-pay | Admitting: Internal Medicine

## 2015-02-22 MED ORDER — HYDROCODONE-ACETAMINOPHEN 5-325 MG PO TABS: ORAL_TABLET | ORAL | Status: DC

## 2015-02-22 NOTE — Telephone Encounter (Signed)
Pt request refill HYDROcodone-acetaminophen (NORCO/VICODIN) 5-325 MG per table

## 2015-02-22 NOTE — Telephone Encounter (Signed)
Pt notified Rx ready for pickup. Rx printed and signed.  

## 2015-03-23 ENCOUNTER — Ambulatory Visit (INDEPENDENT_AMBULATORY_CARE_PROVIDER_SITE_OTHER): Payer: BLUE CROSS/BLUE SHIELD | Admitting: Internal Medicine

## 2015-03-23 ENCOUNTER — Encounter: Payer: Self-pay | Admitting: Internal Medicine

## 2015-03-23 VITALS — BP 160/90 | HR 106 | Temp 98.1°F | Resp 20 | Ht 65.75 in | Wt 223.0 lb

## 2015-03-23 DIAGNOSIS — F900 Attention-deficit hyperactivity disorder, predominantly inattentive type: Secondary | ICD-10-CM | POA: Diagnosis not present

## 2015-03-23 DIAGNOSIS — F909 Attention-deficit hyperactivity disorder, unspecified type: Secondary | ICD-10-CM | POA: Insufficient documentation

## 2015-03-23 MED ORDER — AMPHETAMINE-DEXTROAMPHET ER 20 MG PO CP24: 20.0000 mg | ORAL_CAPSULE | Freq: Every day | ORAL | Status: DC

## 2015-03-23 MED ORDER — SERTRALINE HCL 50 MG PO TABS: 50.0000 mg | ORAL_TABLET | Freq: Every day | ORAL | Status: DC

## 2015-03-23 MED ORDER — AMPHETAMINE-DEXTROAMPHETAMINE 10 MG PO TABS: 10.0000 mg | ORAL_TABLET | Freq: Every day | ORAL | Status: DC

## 2015-03-23 NOTE — Progress Notes (Signed)
Pre visit review using our clinic review tool, if applicable. No additional management support is needed unless otherwise documented below in the visit note. 

## 2015-03-23 NOTE — Patient Instructions (Addendum)
Attention Deficit Hyperactivity Disorder Attention deficit hyperactivity disorder (ADHD) is a problem with behavior issues based on the way the brain functions (neurobehavioral disorder). It is a common reason for behavior and academic problems in school. SYMPTOMS  There are 3 types of ADHD. The 3 types and some of the symptoms include:  Inattentive.  Gets bored or distracted easily.  Loses or forgets things. Forgets to hand in homework.  Has trouble organizing or completing tasks.  Difficulty staying on task.  An inability to organize daily tasks and school work.  Leaving projects, chores, or homework unfinished.  Trouble paying attention or responding to details. Careless mistakes.  Difficulty following directions. Often seems like is not listening.  Dislikes activities that require sustained attention (like chores or homework).  Hyperactive-impulsive.  Feels like it is impossible to sit still or stay in a seat. Fidgeting with hands and feet.  Trouble waiting turn.  Talking too much or out of turn. Interruptive.  Speaks or acts impulsively.  Aggressive, disruptive behavior.  Constantly busy or on the go; noisy.  Often leaves seat when they are expected to remain seated.  Often runs or climbs where it is not appropriate, or feels very restless.  Combined.  Has symptoms of both of the above. Often children with ADHD feel discouraged about themselves and with school. They often perform well below their abilities in school. As children get older, the excess motor activities can calm down, but the problems with paying attention and staying organized persist. Most children do not outgrow ADHD but with good treatment can learn to cope with the symptoms. DIAGNOSIS  When ADHD is suspected, the diagnosis should be made by professionals trained in ADHD. This professional will collect information about the individual suspected of having ADHD. Information must be collected from  various settings where the person lives, works, or attends school.  Diagnosis will include:  Confirming symptoms began in childhood.  Ruling out other reasons for the child's behavior.  The health care providers will check with the child's school and check their medical records.  They will talk to teachers and parents.  Behavior rating scales for the child will be filled out by those dealing with the child on a daily basis. A diagnosis is made only after all information has been considered. TREATMENT  Treatment usually includes behavioral treatment, tutoring or extra support in school, and stimulant medicines. Because of the way a person's brain works with ADHD, these medicines decrease impulsivity and hyperactivity and increase attention. This is different than how they would work in a person who does not have ADHD. Other medicines used include antidepressants and certain blood pressure medicines. Most experts agree that treatment for ADHD should address all aspects of the person's functioning. Along with medicines, treatment should include structured classroom management at school. Parents should reward good behavior, provide constant discipline, and set limits. Tutoring should be available for the child as needed. ADHD is a lifelong condition. If untreated, the disorder can have long-term serious effects into adolescence and adulthood. HOME CARE INSTRUCTIONS   Often with ADHD there is a lot of frustration among family members dealing with the condition. Blame and anger are also feelings that are common. In many cases, because the problem affects the family as a whole, the entire family may need help. A therapist can help the family find better ways to handle the disruptive behaviors of the person with ADHD and promote change. If the person with ADHD is young, most of the therapist's   work is with the parents. Parents will learn techniques for coping with and improving their child's behavior.  Sometimes only the child with the ADHD needs counseling. Your health care providers can help you make these decisions.  Children with ADHD may need help learning how to organize. Some helpful tips include:  Keep routines the same every day from wake-up time to bedtime. Schedule all activities, including homework and playtime. Keep the schedule in a place where the person with ADHD will often see it. Mark schedule changes as far in advance as possible.  Schedule outdoor and indoor recreation.  Have a place for everything and keep everything in its place. This includes clothing, backpacks, and school supplies.  Encourage writing down assignments and bringing home needed books. Work with your child's teachers for assistance in organizing school work.  Offer your child a well-balanced diet. Breakfast that includes a balance of whole grains, protein, and fruits or vegetables is especially important for school performance. Children should avoid drinks with caffeine including:  Soft drinks.  Coffee.  Tea.  However, some older children (adolescents) may find these drinks helpful in improving their attention. Because it can also be common for adolescents with ADHD to become addicted to caffeine, talk with your health care provider about what is a safe amount of caffeine intake for your child.  Children with ADHD need consistent rules that they can understand and follow. If rules are followed, give small rewards. Children with ADHD often receive, and expect, criticism. Look for good behavior and praise it. Set realistic goals. Give clear instructions. Look for activities that can foster success and self-esteem. Make time for pleasant activities with your child. Give lots of affection.  Parents are their children's greatest advocates. Learn as much as possible about ADHD. This helps you become a stronger and better advocate for your child. It also helps you educate your child's teachers and instructors  if they feel inadequate in these areas. Parent support groups are often helpful. A national group with local chapters is called Children and Adults with Attention Deficit Hyperactivity Disorder (CHADD). SEEK MEDICAL CARE IF:  Your child has repeated muscle twitches, cough, or speech outbursts.  Your child has sleep problems.  Your child has a marked loss of appetite.  Your child develops depression.  Your child has new or worsening behavioral problems.  Your child develops dizziness.  Your child has a racing heart.  Your child has stomach pains.  Your child develops headaches. SEEK IMMEDIATE MEDICAL CARE IF:  Your child has been diagnosed with depression or anxiety and the symptoms seem to be getting worse.  Your child has been depressed and suddenly appears to have increased energy or motivation.  You are worried that your child is having a bad reaction to a medication he or she is taking for ADHD. Document Released: 08/01/2002 Document Revised: 08/16/2013 Document Reviewed: 04/18/2013 ExitCare Patient Information 2015 [employer]. This information is not intended to replace advice given to you by your health care provider. Make sure you discuss any questions you have with your health care provider. Amphetamine; Dextroamphetamine extended-release capsules What is this medicine? AMPHETAMINE; DEXTROAMPHETAMINE (am FET a meen; dex troe am FET a meen) is used to treat attention-deficit hyperactivity disorder (ADHD). Federal law prohibits giving this medicine to any person other than the person for whom it was prescribed. Do not share this medicine with anyone else. This medicine may be used for other purposes; ask your health care provider or pharmacist if you   have questions. COMMON BRAND NAME(S): Adderall XR What should I tell my health care provider before I take this medicine? They need to know if you have any of these conditions: -anxiety or panic attacks -circulation  problems in fingers and toes -glaucoma -hardening or blockages of the arteries or heart blood vessels -heart disease or a heart defect -high blood pressure -history of a drug or alcohol abuse problem -history of stroke -kidney disease -liver disease -mental illness -seizures -suicidal thoughts, plans, or attempt; a previous suicide attempt by you or a family member -thyroid disease -Tourette's syndrome -an unusual or allergic reaction to dextroamphetamine, other amphetamines, other medicines, foods, dyes, or preservatives -pregnant or trying to get pregnant -breast-feeding How should I use this medicine? Take this medicine by mouth with a glass of water. Follow the directions on the prescription label. This medicine is taken just one time per day, usually in the morning after waking up. Take with or without food. Do not chew or crush this medicine. You may open the capsules and sprinkle the medicine onto a spoon of applesauce. If sprinkled on applesauce, take the dose immediately and do not crush or chew. Always drink a glass of water or other liquid after taking this medicine. Do not take your medicine more often than directed. A special MedGuide will be given to you by the pharmacist with each prescription and refill. Be sure to read this information carefully each time. Talk to your pediatrician regarding the use of this medicine in children. While this drug may be prescribed for children as young as 6 years for selected conditions, precautions do apply. Overdosage: If you think you have taken too much of this medicine contact a poison control center or emergency room at once. NOTE: This medicine is only for you. Do not share this medicine with others. What if I miss a dose? If you miss a dose, take it as soon as you can. If it is almost time for your next dose, take only that dose. Do not take double or extra doses. What may interact with this medicine? Do not take this medicine with  any of the following medications: -certain medicines for migraine headache like almotriptan, eletriptan, frovatriptan, naratriptan, rizatriptan, sumatriptan, zolmitriptan -MAOIs like Carbex, Eldepryl, Marplan, Nardil, and Parnate -meperidine -other stimulant medicines for attention disorders, weight loss, or to stay awake -pimozide -procarbazine This medicine may also interact with the following medications: -acetazolamide -ammonium chloride -antacids -ascorbic acid -atomoxetine -caffeine -certain medicines for blood pressure -certain medicines for depression, anxiety, or psychotic disturbances -certain medicines for diabetes -certain medicines for seizures like carbamazepine, phenobarbital, phenytoin -certain medicines for stomach problems like cimetidine, famotidine, omeprazole, lansoprazole -cold or allergy medicines -glutamic acid -methenamine -narcotic medicines for pain -norepinephrine -phenothiazines like chlorpromazine, mesoridazine, prochlorperazine, thioridazine -sodium acid phosphate -sodium bicarbonate This list may not describe all possible interactions. Give your health care provider a list of all the medicines, herbs, non-prescription drugs, or dietary supplements you use. Also tell them if you smoke, drink alcohol, or use illegal drugs. Some items may interact with your medicine. What should I watch for while using this medicine? Visit your doctor or health care professional for regular checks on your progress. This prescription requires that you follow special procedures with your doctor and pharmacy. You will need to have a new written prescription from your doctor every time you need a refill. This medicine may affect your concentration, or hide signs of tiredness. Until you know how this medicine affects you, do   not drive, ride a bicycle, use machinery, or do anything that needs mental alertness. Tell your doctor or health care professional if this medicine loses  its effects, or if you feel you need to take more than the prescribed amount. Do not change the dosage without talking to your doctor or health care professional. Decreased appetite is a common side effect when starting this medicine. Eating small, frequent meals or snacks can help. Talk to your doctor if you continue to have poor eating habits. Height and weight growth of a child taking this medicine will be monitored closely. Do not take this medicine close to bedtime. It may prevent you from sleeping. If you are going to need surgery, an MRI, a CT scan, or other procedure, tell your doctor that you are taking this medicine. You may need to stop taking this medicine before the procedure. Tell your doctor or healthcare professional right away if you notice unexplained wounds on your fingers and toes while taking this medicine. You should also tell your healthcare provider if you experience numbness or pain, changes in the skin color, or sensitivity to temperature in your fingers or toes. What side effects may I notice from receiving this medicine? Side effects that you should report to your doctor or health care professional as soon as possible: -allergic reactions like skin rash, itching or hives, swelling of the face, lips, or tongue -changes in vision -chest pain or chest tightness -confusion, trouble speaking or understanding -fast, irregular heartbeat -fingers or toes feel numb, cool, painful -hallucination, loss of contact with reality -high blood pressure -males: prolonged or painful erection -seizures -severe headaches -shortness of breath -suicidal thoughts or other mood changes -trouble walking, dizziness, loss of balance or coordination -uncontrollable head, mouth, neck, arm, or leg movements Side effects that usually do not require medical attention (report to your doctor or health care professional if they continue or are bothersome): -anxious -headache -loss of  appetite -nausea, vomiting -trouble sleeping -weight loss This list may not describe all possible side effects. Call your doctor for medical advice about side effects. You may report side effects to FDA at 1-800-FDA-1088. Where should I keep my medicine? Keep out of the reach of children. This medicine can be abused. Keep your medicine in a safe place to protect it from theft. Do not share this medicine with anyone. Selling or giving away this medicine is dangerous and against the law. Store at room temperature between 15 and 30 degrees C (59 and 86 degrees F). Keep container tightly closed. Protect from light. Throw away any unused medicine after the expiration date. NOTE: This sheet is a summary. It may not cover all possible information. If you have questions about this medicine, talk to your doctor, pharmacist, or health care provider.  2015, [employer]. (2013-06-24 18:16:00)  

## 2015-03-23 NOTE — Progress Notes (Signed)
 Subjective:    Patient ID: Shaun Barron, male    DOB: 11/24/1975, 39 y.o.   MRN: [id number]  HPI  39-year-old patient who is seen today to discuss concerns about ADD.  He states that his 9-year-old son has been diagnosed with ADHD and has been treated with Adderall.  He has wife have been reading extensively about ADD and they both feel that he also has this disorder.  He states that he was on Ritalin as a child. Complaints include procrastination, impulsivity, and easy distractibility.  He states that he is very poorly organized and is unable to finish projects.  He states that work he must do several projects several times where his other coworkers completes the task  much more easily.  He states that he self treated with his son's Adderall and was able to focus much better. Impulsive spending, resulting in large credit card debt has caused marital strain.  He has been on sertraline as well as when necessary alprazolam due to anxiety.  He states that he has trouble sleeping due to intrusive thoughts and anxiety  Past Medical History  Diagnosis Date  . ALLERGIC RHINITIS 10/09/2008  . ANXIETY 10/09/2008  . GYNECOMASTIA 04/16/2010  . SHOULDER PAIN, LEFT, CHRONIC 10/19/2008    History   Social History  . Marital Status: Married    Spouse Name: N/A  . Number of Children: N/A  . 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 Smoking status: Former Smoker    Quit date: 09/25/2005  . Smokeless tobacco: Never Used  . Alcohol Use: Yes     Comment: rarely  . Drug Use: No  . Sexual Activity: Not on file   Other Topics Concern  . Not on file   Social History Narrative   One step sister and one step brother, both in good health      3 year old son, 5-year old daughter    Past Surgical History  Procedure Laterality Date  . Lasik      Family History  Problem Relation Age of Onset  . Coronary artery disease  Father     bilat carotid artery stenosis:s/p CABG  . Cancer Mother     hx multiple myeloma-status post stem transpantation July 2008 and presently in remission    No Known Allergies  Current Outpatient Prescriptions on File Prior to Visit  Medication Sig Dispense Refill  . ALPRAZolam (XANAX) 0.5 MG tablet TAKE ONE TABLET BY MOUTH THREE TIMES DAILY AS NEEDED FOR SLEEP 60 tablet 5  . Triamcinolone Acetonide (NASACORT AQ NA) Place 1 spray into the nose daily. OTC     No current facility-administered medications on file prior to visit.    BP 160/90 mmHg  Pulse 106  Temp(Src) 98.1 F (36.7 C) (Oral)  Resp 20  Ht 5' 5.75" (1.67 m)  Wt 223 lb (101.152 kg)  BMI 36.27 kg/m2  SpO2 98%     Review of Systems  Constitutional: Negative for fever, chills, appetite change and fatigue.  HENT: Negative for congestion, dental problem, ear pain, hearing loss, sore throat, tinnitus, trouble swallowing and voice change.   Eyes: Negative for pain, discharge and visual disturbance.  Respiratory: Negative for cough, chest tightness, wheezing and stridor.   Cardiovascular: Negative for chest pain, palpitations and leg swelling.  Gastrointestinal: Negative for nausea, vomiting, abdominal pain, diarrhea, constipation, blood in stool and abdominal distention.  Genitourinary: Negative for   urgency, hematuria, flank pain, discharge, difficulty urinating and genital sores.  Musculoskeletal: Negative for myalgias, back pain, joint swelling, arthralgias, gait problem and neck stiffness.  Skin: Negative for rash.  Neurological: Negative for dizziness, syncope, speech difficulty, weakness, numbness and headaches.  Hematological: Negative for adenopathy. Does not bruise/bleed easily.  Psychiatric/Behavioral: Positive for behavioral problems, sleep disturbance and decreased concentration. Negative for dysphoric mood. The patient is nervous/anxious.        Objective:   Physical Exam  Constitutional: He  appears well-developed and well-nourished. No distress.  Blood pressure 140/80 Overweight No distress  Psychiatric: He has a normal mood and affect. His behavior is normal. Judgment and thought content normal.          Assessment & Plan:   Probable ADHD.  Patient was asked to read the book : "Driven to distraction" Recheck 4-6 weeks Start Adderall XR 20 morning and short acting Adderall 10 in the early to mid afternoon

## 2015-04-06 ENCOUNTER — Telehealth: Payer: Self-pay | Admitting: Internal Medicine

## 2015-04-06 NOTE — Telephone Encounter (Signed)
Pt request refill of the following: HYDROcodone-acetaminophen (NORCO/VICODIN) 5-325 MG per tablet ° ° °Phamacy: °

## 2015-04-06 NOTE — Telephone Encounter (Signed)
Refill ok? 

## 2015-04-09 MED ORDER — HYDROCODONE-ACETAMINOPHEN 5-325 MG PO TABS: ORAL_TABLET | ORAL | Status: DC

## 2015-04-09 NOTE — Telephone Encounter (Signed)
ok 

## 2015-04-09 NOTE — Telephone Encounter (Signed)
Left detailed message Rx ready for pickup, will be at the front desk. Rx printed and signed. 

## 2015-04-23 ENCOUNTER — Encounter: Payer: Self-pay | Admitting: Internal Medicine

## 2015-04-23 ENCOUNTER — Ambulatory Visit (INDEPENDENT_AMBULATORY_CARE_PROVIDER_SITE_OTHER): Payer: BLUE CROSS/BLUE SHIELD | Admitting: Internal Medicine

## 2015-04-23 VITALS — BP 142/90 | HR 89 | Temp 98.4°F | Resp 20 | Ht 65.75 in | Wt 210.0 lb

## 2015-04-23 DIAGNOSIS — F900 Attention-deficit hyperactivity disorder, predominantly inattentive type: Secondary | ICD-10-CM

## 2015-04-23 MED ORDER — AMPHETAMINE-DEXTROAMPHETAMINE 10 MG PO TABS
ORAL_TABLET | ORAL | Status: DC
Start: 2015-04-23 — End: 2015-04-23

## 2015-04-23 MED ORDER — AMPHETAMINE-DEXTROAMPHETAMINE 10 MG PO TABS: ORAL_TABLET | ORAL | Status: DC

## 2015-04-23 NOTE — Progress Notes (Signed)
Subjective:    Patient ID: Shaun Barron, male    DOB: 1975-12-22, 39 y.o.   MRN: [id number]  HPI  39 year old patient who is seen today in follow-up for ADHD.  He has done quite well on his present regimen but is objective to the cost of the extended release Adderall.  No side effects.  He is eating better and discontinued use of sodas and there has been some modest weight loss.  He does feel that his appetite is blunted.  No sleep  or anxiety issues  Wt Readings from Last 3 Encounters:  04/23/15 210 lb (95.255 kg)  03/23/15 223 lb (101.152 kg)  12/28/14 210 lb (95.255 kg)    BP Readings from Last 3 Encounters:  04/23/15 142/90  03/23/15 160/90  12/28/14 120/80    Past Medical History  Diagnosis Date  . ALLERGIC RHINITIS 10/09/2008  . ANXIETY 10/09/2008  . GYNECOMASTIA 04/16/2010  . SHOULDER PAIN, LEFT, CHRONIC 10/19/2008    Social History   Social History  . Marital Status: Married    Spouse Name: N/A  . Number of Children: N/A  . 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 Smoking status: Former Smoker    Quit date: 09/25/2005  . Smokeless tobacco: Never Used  . Alcohol Use: Yes     Comment: rarely  . Drug Use: No  . Sexual Activity: Not on file   Other Topics Concern  . Not on file   Social History Narrative   One step sister and one step brother, both in good health      60 year old son, 49-year old daughter    Past Surgical History  Procedure Laterality Date  . Lasik      Family History  Problem Relation Age of Onset  . Coronary artery disease Father     bilat carotid artery stenosis:s/p CABG  . Cancer Mother     hx multiple myeloma-status post stem transpantation July 2008 and presently in remission    No Known Allergies  Current Outpatient Prescriptions on File Prior to Visit  Medication Sig Dispense Refill  . ALPRAZolam (XANAX) 0.5 MG tablet TAKE ONE TABLET BY MOUTH THREE  TIMES DAILY AS NEEDED FOR SLEEP 60 tablet 5  . HYDROcodone-acetaminophen (NORCO/VICODIN) 5-325 MG per tablet TAKE ONE TABLET BY MOUTH EVERY 6 HOURS AS NEEDED FOR PAIN 60 tablet 0  . sertraline (ZOLOFT) 50 MG tablet Take 1 tablet (50 mg total) by mouth daily. 90 tablet 3  . Triamcinolone Acetonide (NASACORT AQ NA) Place 1 spray into the nose daily. OTC     No current facility-administered medications on file prior to visit.    BP 142/90 mmHg  Pulse 89  Temp(Src) 98.4 F (36.9 C) (Oral)  Resp 20  Ht 5' 5.75" (1.67 m)  Wt 210 lb (95.255 kg)  BMI 34.16 kg/m2  SpO2 98%     Review of Systems  Constitutional: Negative for fever, chills, appetite change and fatigue.  HENT: Negative for congestion, dental problem, ear pain, hearing loss, sore throat, tinnitus, trouble swallowing and voice change.   Eyes: Negative for pain, discharge and visual disturbance.  Respiratory: Negative for cough, chest tightness, wheezing and stridor.   Cardiovascular: Negative for chest pain, palpitations and leg swelling.  Gastrointestinal: Negative for nausea, vomiting, abdominal pain, diarrhea, constipation, blood in stool and abdominal distention.  Genitourinary: Negative for urgency, hematuria, flank pain, discharge, difficulty urinating  and genital sores.  Musculoskeletal: Negative for myalgias, back pain, joint swelling, arthralgias, gait problem and neck stiffness.  Skin: Negative for rash.  Neurological: Negative for dizziness, syncope, speech difficulty, weakness, numbness and headaches.  Hematological: Negative for adenopathy. Does not bruise/bleed easily.  Psychiatric/Behavioral: Negative for behavioral problems and dysphoric mood. The patient is not nervous/anxious.        Objective:   Physical Exam  Constitutional: He appears well-developed and well-nourished. No distress.  Blood pressure 140/85          Assessment & Plan:  ADHD.  Well-controlled on present regimen.  The patient objects  to the cost of the extended release Adderall.  We'll switch to a 3 times a day regimen.  A short acting Adderall Obesity.  Improved Rule out hypertension.  Blood pressure is high normal today.  Hopefully this will improve with additional weight loss.  This was discussed.  Patient placed on a DASH diet  Recheck 6 months Home blood pressure monitoring.  Encouraged

## 2015-04-23 NOTE — Progress Notes (Signed)
Pre visit review using our clinic review tool, if applicable. No additional management support is needed unless otherwise documented below in the visit note. 

## 2015-04-23 NOTE — Patient Instructions (Signed)
Limit your sodium (Salt) intake    It is important that you exercise regularly, at least 20 minutes 3 to 4 times per week.  If you develop chest pain or shortness of breath seek  medical attention.  Please check your blood pressure on a regular basis.  If it is consistently greater than 150/90, please make an office appointment.  DASH Eating Plan DASH stands for "Dietary Approaches to Stop Hypertension." The DASH eating plan is a healthy eating plan that has been shown to reduce high blood pressure (hypertension). Additional health benefits may include reducing the risk of type 2 diabetes mellitus, heart disease, and stroke. The DASH eating plan may also help with weight loss. WHAT DO I NEED TO KNOW ABOUT THE DASH EATING PLAN? For the DASH eating plan, you will follow these general guidelines:  Choose foods with a percent daily value for sodium of less than 5% (as listed on the food label).  Use salt-free seasonings or herbs instead of table salt or sea salt.  Check with your health care provider or pharmacist before using salt substitutes.  Eat lower-sodium products, often labeled as "lower sodium" or "no salt added."  Eat fresh foods.  Eat more vegetables, fruits, and low-fat dairy products.  Choose whole grains. Look for the word "whole" as the first word in the ingredient list.  Choose fish and skinless chicken or [place] more often than red meat. Limit fish, poultry, and meat to 6 oz (170 g) each day.  Limit sweets, desserts, sugars, and sugary drinks.  Choose heart-healthy fats.  Limit cheese to 1 oz (28 g) per day.  Eat more home-cooked food and less restaurant, buffet, and fast food.  Limit fried foods.  Cook foods using methods other than frying.  Limit canned vegetables. If you do use them, rinse them well to decrease the sodium.  When eating at a restaurant, ask that your food be prepared with less salt, or no salt if possible. WHAT FOODS CAN I EAT? Seek help from  a dietitian for individual calorie needs. Grains Whole grain or whole wheat bread. Brown rice. Whole grain or whole wheat pasta. Quinoa, bulgur, and whole grain cereals. Low-sodium cereals. Corn or whole wheat flour tortillas. Whole grain cornbread. Whole grain crackers. Low-sodium crackers. Vegetables Fresh or frozen vegetables (raw, steamed, roasted, or grilled). Low-sodium or reduced-sodium tomato and vegetable juices. Low-sodium or reduced-sodium tomato sauce and paste. Low-sodium or reduced-sodium canned vegetables.  Fruits All fresh, canned (in natural juice), or frozen fruits. Meat and Other Protein Products Ground beef (85% or leaner), grass-fed beef, or beef trimmed of fat. Skinless chicken or [place]. Ground chicken or [place]. Pork trimmed of fat. All fish and seafood. Eggs. Dried beans, peas, or lentils. Unsalted nuts and seeds. Unsalted canned beans. Dairy Low-fat dairy products, such as skim or 1% milk, 2% or reduced-fat cheeses, low-fat ricotta or cottage cheese, or plain low-fat yogurt. Low-sodium or reduced-sodium cheeses. Fats and Oils Tub margarines without trans fats. Light or reduced-fat mayonnaise and salad dressings (reduced sodium). Avocado. Safflower, olive, or canola oils. Natural peanut or almond butter. Other Unsalted popcorn and pretzels. The items listed above may not be a complete list of recommended foods or beverages. Contact your dietitian for more options. WHAT FOODS ARE NOT RECOMMENDED? Grains White bread. White pasta. White rice. Refined cornbread. Bagels and croissants. Crackers that contain trans fat. Vegetables Creamed or fried vegetables. Vegetables in a cheese sauce. Regular canned vegetables. Regular canned tomato sauce and paste. Regular tomato  and vegetable juices. Fruits Dried fruits. Canned fruit in light or heavy syrup. Fruit juice. Meat and Other Protein Products Fatty cuts of meat. Ribs, chicken wings, bacon, sausage, bologna, salami,  chitterlings, fatback, hot dogs, bratwurst, and packaged luncheon meats. Salted nuts and seeds. Canned beans with salt. Dairy Whole or 2% milk, cream, half-and-half, and cream cheese. Whole-fat or sweetened yogurt. Full-fat cheeses or blue cheese. Nondairy creamers and whipped toppings. Processed cheese, cheese spreads, or cheese curds. Condiments Onion and garlic salt, seasoned salt, table salt, and sea salt. Canned and packaged gravies. Worcestershire sauce. Tartar sauce. Barbecue sauce. Teriyaki sauce. Soy sauce, including reduced sodium. Steak sauce. Fish sauce. Oyster sauce. Cocktail sauce. Horseradish. Ketchup and mustard. Meat flavorings and tenderizers. Bouillon cubes. Hot sauce. Tabasco sauce. Marinades. Taco seasonings. Relishes. Fats and Oils Butter, stick margarine, lard, shortening, ghee, and bacon fat. Coconut, palm kernel, or palm oils. Regular salad dressings. Other Pickles and olives. Salted popcorn and pretzels. The items listed above may not be a complete list of foods and beverages to avoid. Contact your dietitian for more information. WHERE CAN I FIND MORE INFORMATION? National Heart, Lung, and Blood Institute: travelstabloid.com Document Released: 07/31/2011 Document Revised: 12/26/2013 Document Reviewed: 06/15/2013 [facility] Patient Information 2015 [place], [place]. This information is not intended to replace advice given to you by your health care provider. Make sure you discuss any questions you have with your health care provider.

## 2015-05-10 ENCOUNTER — Telehealth: Payer: Self-pay | Admitting: Internal Medicine

## 2015-05-10 MED ORDER — HYDROCODONE-ACETAMINOPHEN 5-325 MG PO TABS: ORAL_TABLET | ORAL | Status: DC

## 2015-05-10 NOTE — Telephone Encounter (Signed)
Pt request refill HYDROcodone-acetaminophen (NORCO/VICODIN) 5-325 MG per tablet °

## 2015-05-10 NOTE — Telephone Encounter (Signed)
Pt came by and pickup Rx for Hydrocodone. Rx printed and signed.

## 2015-06-07 ENCOUNTER — Other Ambulatory Visit: Payer: Self-pay | Admitting: Internal Medicine

## 2015-06-07 NOTE — Telephone Encounter (Signed)
Pt needs new rx hydrocodone °

## 2015-06-07 NOTE — Telephone Encounter (Signed)
Rx last filled 9.15.2016.  Pt last seen 8.29.2016. Pls advise.

## 2015-06-08 MED ORDER — HYDROCODONE-ACETAMINOPHEN 5-325 MG PO TABS: ORAL_TABLET | ORAL | Status: DC

## 2015-06-08 NOTE — Telephone Encounter (Signed)
OK #[id number] 

## 2015-06-08 NOTE — Telephone Encounter (Signed)
Called and spoke with pt and pt is aware.  

## 2015-07-26 ENCOUNTER — Telehealth: Payer: Self-pay | Admitting: Internal Medicine

## 2015-07-26 NOTE — Telephone Encounter (Signed)
Pt request refill of the following: amphetamine-dextroamphetamine (ADDERALL) 10 MG tablet , HYDROcodone-acetaminophen (NORCO/VICODIN) 5-325 MG tablet , ALPRAZolam (XANAX) 0.5 MG tablet   Phamacy:

## 2015-07-26 NOTE — Telephone Encounter (Signed)
Pt is aware md out of office today and will be back tomorrow

## 2015-07-27 MED ORDER — ALPRAZOLAM 0.5 MG PO TABS: ORAL_TABLET | ORAL | Status: DC

## 2015-07-27 MED ORDER — AMPHETAMINE-DEXTROAMPHETAMINE 10 MG PO TABS: 10.0000 mg | ORAL_TABLET | Freq: Three times a day (TID) | ORAL | Status: DC | PRN

## 2015-07-27 MED ORDER — AMPHETAMINE-DEXTROAMPHETAMINE 10 MG PO TABS: ORAL_TABLET | ORAL | Status: DC

## 2015-07-27 MED ORDER — HYDROCODONE-ACETAMINOPHEN 5-325 MG PO TABS: ORAL_TABLET | ORAL | Status: DC

## 2015-07-27 NOTE — Telephone Encounter (Signed)
Left detailed message on personal voicemail all Rx's are ready for pickup, will be at the front desk. Rx's printed and signed.

## 2015-08-28 ENCOUNTER — Telehealth: Payer: Self-pay | Admitting: Internal Medicine

## 2015-08-28 MED ORDER — HYDROCODONE-ACETAMINOPHEN 5-325 MG PO TABS: ORAL_TABLET | ORAL | Status: DC

## 2015-08-28 NOTE — Telephone Encounter (Signed)
Pt notified Rx ready for pickup. Rx printed and signed.  

## 2015-08-28 NOTE — Telephone Encounter (Signed)
Pt called requesting refill on Hydrocodone.  Please advise.

## 2015-08-28 NOTE — Telephone Encounter (Signed)
Noted  

## 2015-08-28 NOTE — Telephone Encounter (Signed)
The patient came in today to get his Rx and there was a note for him to schedule his physical appointment for future refills. He already had physical and labs scheduled.

## 2015-10-23 ENCOUNTER — Telehealth: Payer: Self-pay | Admitting: Internal Medicine

## 2015-10-23 MED ORDER — AMPHETAMINE-DEXTROAMPHETAMINE 10 MG PO TABS: 10.0000 mg | ORAL_TABLET | Freq: Three times a day (TID) | ORAL | Status: DC | PRN

## 2015-10-23 MED ORDER — AMPHETAMINE-DEXTROAMPHETAMINE 10 MG PO TABS: ORAL_TABLET | ORAL | Status: DC

## 2015-10-23 MED ORDER — HYDROCODONE-ACETAMINOPHEN 5-325 MG PO TABS: ORAL_TABLET | ORAL | Status: DC

## 2015-10-23 NOTE — Telephone Encounter (Signed)
Pt notified Rx's ready for pickup. Rx's printed and signed.  

## 2015-10-23 NOTE — Telephone Encounter (Signed)
Pt needs new rxs hydrocodone and generic adderall 10 mg

## 2015-11-28 ENCOUNTER — Telehealth: Payer: Self-pay | Admitting: Internal Medicine

## 2015-11-28 MED ORDER — HYDROCODONE-ACETAMINOPHEN 5-325 MG PO TABS: ORAL_TABLET | ORAL | Status: DC

## 2015-11-28 NOTE — Telephone Encounter (Signed)
Pt needs new Rx for Hydrocodone

## 2015-11-28 NOTE — Telephone Encounter (Signed)
Pt notified Rx ready for pickup. Rx printed and signed.  

## 2015-12-20 ENCOUNTER — Telehealth: Payer: Self-pay | Admitting: Internal Medicine

## 2015-12-20 ENCOUNTER — Other Ambulatory Visit: Payer: Self-pay | Admitting: Internal Medicine

## 2015-12-20 NOTE — Telephone Encounter (Signed)
Pt request refill amphetamine-dextroamphetamine (ADDERALL) 10 MG tablet °3 mo supply °

## 2015-12-20 NOTE — Telephone Encounter (Signed)
Pt state he has misplaced one of the three Rx for Adderall and wish to have another one.

## 2015-12-20 NOTE — Telephone Encounter (Signed)
Spoke to pt, told him he should have one Rx left. Rx's were given Feb 28th. Pt verbalized understanding and will look at home and let me know if he does not have one. Told him okay.

## 2015-12-21 MED ORDER — AMPHETAMINE-DEXTROAMPHETAMINE 10 MG PO TABS: ORAL_TABLET | ORAL | Status: DC

## 2015-12-21 MED ORDER — AMPHETAMINE-DEXTROAMPHETAMINE 10 MG PO TABS: 10.0000 mg | ORAL_TABLET | Freq: Three times a day (TID) | ORAL | Status: DC | PRN

## 2015-12-21 NOTE — Telephone Encounter (Signed)
Patient will come in Monday to pick up his Rx

## 2015-12-21 NOTE — Telephone Encounter (Signed)
FYI  Per Marcelino DusterMichelle its ok for pt to get a refill

## 2015-12-21 NOTE — Telephone Encounter (Signed)
Rx is ready for pick up. Pt is aware that he only going to get one month supply until Dr Kirtland BouchardK comes back

## 2015-12-24 ENCOUNTER — Other Ambulatory Visit (INDEPENDENT_AMBULATORY_CARE_PROVIDER_SITE_OTHER): Payer: BLUE CROSS/BLUE SHIELD

## 2015-12-24 DIAGNOSIS — Z0000 Encounter for general adult medical examination without abnormal findings: Secondary | ICD-10-CM

## 2015-12-24 LAB — LIPID PANEL
CHOLESTEROL: 130 mg/dL (ref 0–200)
HDL: 56 mg/dL (ref >39.00)
LDL Cholesterol: 59 mg/dL (ref 0–99)
NONHDL: 73.62
TRIGLYCERIDES: 72 mg/dL (ref 0.0–149.0)
Total CHOL/HDL Ratio: 2
VLDL: 14.4 mg/dL (ref 0.0–40.0)

## 2015-12-24 LAB — CBC WITH DIFFERENTIAL/PLATELET
BASOS PCT: 0.6 % (ref 0.0–3.0)
Basophils Absolute: 0 10*3/uL (ref 0.0–0.1)
EOS ABS: 0.1 10*3/uL (ref 0.0–0.7)
EOS PCT: 1.8 % (ref 0.0–5.0)
HCT: 42.2 % (ref 39.0–52.0)
HEMOGLOBIN: 14.5 g/dL (ref 13.0–17.0)
LYMPHS ABS: 2.7 10*3/uL (ref 0.7–4.0)
Lymphocytes Relative: 36.2 % (ref 12.0–46.0)
MCHC: 34.3 g/dL (ref 30.0–36.0)
MCV: 89.3 fl (ref 78.0–100.0)
MONO ABS: 0.5 10*3/uL (ref 0.1–1.0)
MONOS PCT: 6.5 % (ref 3.0–12.0)
NEUTROS PCT: 54.9 % (ref 43.0–77.0)
Neutro Abs: 4.2 10*3/uL (ref 1.4–7.7)
Platelets: 308 10*3/uL (ref 150.0–400.0)
RBC: 4.72 Mil/uL (ref 4.22–5.81)
RDW: 12.3 % (ref 11.5–15.5)
WBC: 7.6 10*3/uL (ref 4.0–10.5)

## 2015-12-24 LAB — BASIC METABOLIC PANEL
BUN: 8 mg/dL (ref 6–23)
CHLORIDE: 101 meq/L (ref 96–112)
CO2: 26 mEq/L (ref 19–32)
Calcium: 9.1 mg/dL (ref 8.4–10.5)
Creatinine, Ser: 0.64 mg/dL (ref 0.40–1.50)
GFR: 145.93 mL/min (ref >60.00)
Glucose, Bld: 122 mg/dL — ABNORMAL HIGH (ref 70–99)
POTASSIUM: 3.9 meq/L (ref 3.5–5.1)
SODIUM: 135 meq/L (ref 135–145)

## 2015-12-24 LAB — POC URINALSYSI DIPSTICK (AUTOMATED)
Bilirubin, UA: NEGATIVE
Blood, UA: NEGATIVE
Glucose, UA: NEGATIVE
KETONES UA: NEGATIVE
LEUKOCYTES UA: NEGATIVE
NITRITE UA: NEGATIVE
PH UA: 7
PROTEIN UA: NEGATIVE
Spec Grav, UA: 1.01
Urobilinogen, UA: 0.2

## 2015-12-24 LAB — HEPATIC FUNCTION PANEL
ALT: 28 U/L (ref 0–53)
AST: 15 U/L (ref 0–37)
Albumin: 4 g/dL (ref 3.5–5.2)
Alkaline Phosphatase: 69 U/L (ref 39–117)
BILIRUBIN DIRECT: 0.1 mg/dL (ref 0.0–0.3)
BILIRUBIN TOTAL: 0.5 mg/dL (ref 0.2–1.2)
Total Protein: 7.1 g/dL (ref 6.0–8.3)

## 2015-12-24 LAB — TSH: TSH: 1 u[IU]/mL (ref 0.35–4.50)

## 2015-12-31 ENCOUNTER — Ambulatory Visit (INDEPENDENT_AMBULATORY_CARE_PROVIDER_SITE_OTHER): Payer: BLUE CROSS/BLUE SHIELD | Admitting: Internal Medicine

## 2015-12-31 ENCOUNTER — Encounter: Payer: Self-pay | Admitting: Internal Medicine

## 2015-12-31 VITALS — BP 142/90 | HR 99 | Temp 98.8°F | Resp 14 | Ht 66.5 in | Wt 194.0 lb

## 2015-12-31 DIAGNOSIS — F900 Attention-deficit hyperactivity disorder, predominantly inattentive type: Secondary | ICD-10-CM

## 2015-12-31 DIAGNOSIS — Z0000 Encounter for general adult medical examination without abnormal findings: Secondary | ICD-10-CM

## 2015-12-31 DIAGNOSIS — J3089 Other allergic rhinitis: Secondary | ICD-10-CM | POA: Diagnosis not present

## 2015-12-31 MED ORDER — AMPHETAMINE-DEXTROAMPHETAMINE 10 MG PO TABS: 10.0000 mg | ORAL_TABLET | Freq: Three times a day (TID) | ORAL | Status: DC | PRN

## 2015-12-31 MED ORDER — HYDROCODONE-ACETAMINOPHEN 5-325 MG PO TABS: ORAL_TABLET | ORAL | Status: DC

## 2015-12-31 MED ORDER — ALPRAZOLAM 0.5 MG PO TABS: ORAL_TABLET | ORAL | Status: DC

## 2015-12-31 NOTE — Progress Notes (Signed)
Subjective:    Patient ID: Barron Shaun, male    DOB: Sep 03, 1975, 40 y.o.   MRN: [id number]  HPI    Subjective:    Patient ID: Shaun Barron, male    DOB: 1976/05/29, 40 y.o.   MRN: [id number]  HPI 40 year old patient who is seen today for a wellness exam He has a history of gynecomastia since age 41.  Additional problems include allergic rhinitis, anxiety disorder and chronic intermittent left shoulder pain. Laboratory studies reviewed.  Fasting blood sugar slightly elevated  Family history father age 95, status post CABG, history of adult onset diabetes Mother died age 52 of complications of multiple myeloma A number of stepsiblings  Social history nonsmoker.  Married, 2 children, ages 20 and 93  Past Medical History  Diagnosis Date  . ALLERGIC RHINITIS 10/09/2008  . ANXIETY 10/09/2008  . GYNECOMASTIA 04/16/2010  . SHOULDER PAIN, LEFT, CHRONIC 10/19/2008    Social History   Social History  . Marital Status: Married    Spouse Name: N/A  . Number of Children: N/A  . 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 Smoking status: Former Smoker    Quit date: 09/25/2005  . Smokeless tobacco: Never Used  . Alcohol Use: Yes     Comment: rarely  . Drug Use: No  . Sexual Activity: Not on file   Other Topics Concern  . Not on file   Social History Narrative   One step sister and one step brother, both in good health      58 year old son, 40-year old daughter    Past Surgical History  Procedure Laterality Date  . Lasik      Family History  Problem Relation Age of Onset  . Coronary artery disease Father     bilat carotid artery stenosis:s/p CABG  . Cancer Mother     hx multiple myeloma-status post stem transpantation July 2008 and presently in remission    No Known Allergies  Current Outpatient Prescriptions on File Prior to Visit  Medication Sig Dispense Refill  . ALPRAZolam (XANAX) 0.5 MG  tablet TAKE ONE TABLET BY MOUTH THREE TIMES DAILY AS NEEDED FOR SLEEP 60 tablet 5  . amphetamine-dextroamphetamine (ADDERALL) 10 MG tablet Take 1 tablet (10 mg total) by mouth 3 (three) times daily as needed. 90 tablet 0  . HYDROcodone-acetaminophen (NORCO/VICODIN) 5-325 MG tablet TAKE ONE TABLET BY MOUTH EVERY 6 HOURS AS NEEDED FOR PAIN 30 tablet 0  . Triamcinolone Acetonide (NASACORT AQ NA) Place 1 spray into the nose daily. Reported on 12/31/2015    . amphetamine-dextroamphetamine (ADDERALL) 10 MG tablet 1 tablet 3 times daily as needed (Patient not taking: Reported on 12/31/2015) 90 tablet 0  . amphetamine-dextroamphetamine (ADDERALL) 10 MG tablet Take 1 tablet (10 mg total) by mouth 3 (three) times daily as needed. (Patient not taking: Reported on 12/31/2015) 90 tablet 0  . amphetamine-dextroamphetamine (ADDERALL) 10 MG tablet 1 tablet 3 times daily as needed (Patient not taking: Reported on 12/31/2015) 90 tablet 0  . sertraline (ZOLOFT) 50 MG tablet Take 1 tablet (50 mg total) by mouth daily. (Patient not taking: Reported on 12/31/2015) 90 tablet 3   No current facility-administered medications on file prior to visit.    BP 142/90 mmHg  Pulse 99  Temp(Src) 98.8 F (37.1 C) (Oral)  Resp 14  Ht 5' 6.5" (1.689 m)  Wt 194 lb (87.998 kg)  BMI  30.85 kg/m2  SpO2 98%       Review of Systems  Constitutional: Negative for fever, chills, activity change, appetite change and fatigue.  HENT: Negative for congestion, dental problem, ear pain, hearing loss, mouth sores, rhinorrhea, sinus pressure, sneezing, tinnitus, trouble swallowing and voice change.   Eyes: Negative for photophobia, pain, redness and visual disturbance.  Respiratory: Negative for apnea, cough, choking, chest tightness, shortness of breath and wheezing.   Cardiovascular: Negative for chest pain, palpitations and leg swelling.  Gastrointestinal: Negative for nausea, vomiting, abdominal pain, diarrhea, constipation, blood in stool,  abdominal distention, anal bleeding and rectal pain.  Genitourinary: Negative for dysuria, urgency, frequency, hematuria, flank pain, decreased urine volume, discharge, penile swelling, scrotal swelling, difficulty urinating, genital sores and testicular pain.  Musculoskeletal: Negative for myalgias, back pain, joint swelling, arthralgias, gait problem, neck pain and neck stiffness.       Intermittent left shoulder and left knee pain  Skin: Negative for color change, rash and wound.  Neurological: Negative for dizziness, tremors, seizures, syncope, facial asymmetry, speech difficulty, weakness, light-headedness, numbness and headaches.  Hematological: Negative for adenopathy. Does not bruise/bleed easily.  Psychiatric/Behavioral: Negative for suicidal ideas, hallucinations, behavioral problems, confusion, sleep disturbance, self-injury, dysphoric mood, decreased concentration and agitation. The patient is not nervous/anxious.        Objective:   Physical Exam  Constitutional: He appears well-developed and well-nourished.  obese  HENT:  Head: Normocephalic and atraumatic.  Right Ear: External ear normal.  Left Ear: External ear normal.  Nose: Nose normal.  Mouth/Throat: Oropharynx is clear and moist.  Eyes: Conjunctivae and EOM are normal. Pupils are equal, round, and reactive to light. No scleral icterus.  Neck: Normal range of motion. Neck supple. No JVD present. No thyromegaly present.  Cardiovascular: Regular rhythm, normal heart sounds and intact distal pulses.  Exam reveals no gallop and no friction rub.   No murmur heard. Pulmonary/Chest: Effort normal and breath sounds normal. He exhibits no tenderness.  Mild gynecomastia  Abdominal: Soft. Bowel sounds are normal. He exhibits no distension and no mass. There is no tenderness.  Genitourinary: Penis normal.  Musculoskeletal: Normal range of motion. He exhibits no edema or tenderness.  Lymphadenopathy:    He has no cervical  adenopathy.  Neurological: He is alert. He has normal reflexes. No cranial nerve deficit. Coordination normal.  Skin: Skin is warm and dry. No rash noted.  Psychiatric: He has a normal mood and affect. His behavior is normal.          Assessment & Plan:   Preventive health exam Allergic rhinitis Obesity/impaired glucose tolerance.  Weight loss encouraged.  He has started a exercise and weight loss program with some success  Medicines updated    Review of Systems    as above Objective:   Physical Exam Repeat blood pressure 124/82 Weight 194 As above      Assessment & Plan:   Preventive health exam Overweight Impaired glucose tolerance Allergic rhinitis Intermittent chronic left shoulder pain ADHD

## 2015-12-31 NOTE — Progress Notes (Signed)
Pre visit review using our clinic review tool, if applicable. No additional management support is needed unless otherwise documented below in the visit note. 

## 2015-12-31 NOTE — Patient Instructions (Signed)

## 2016-01-24 ENCOUNTER — Telehealth: Payer: Self-pay | Admitting: Family Medicine

## 2016-01-24 NOTE — Telephone Encounter (Signed)
Received a fax from [facility] for pa on Adderall.  Submitted on CoverMyMeds.  Waiting on response.

## 2016-01-29 ENCOUNTER — Telehealth: Payer: Self-pay | Admitting: Internal Medicine

## 2016-01-29 MED ORDER — HYDROCODONE-ACETAMINOPHEN 5-325 MG PO TABS: ORAL_TABLET | ORAL | Status: DC

## 2016-01-29 NOTE — Telephone Encounter (Signed)
Pt needs new rx hydrocodone °

## 2016-01-29 NOTE — Telephone Encounter (Signed)
Pt notified Rx ready for pickup. Rx printed and signed.  

## 2016-02-28 ENCOUNTER — Telehealth: Payer: Self-pay | Admitting: Internal Medicine

## 2016-02-28 NOTE — Telephone Encounter (Signed)
Pt request refill  °HYDROcodone-acetaminophen (NORCO/VICODIN) 5-325 MG tablet °

## 2016-02-29 MED ORDER — HYDROCODONE-ACETAMINOPHEN 5-325 MG PO TABS: ORAL_TABLET | ORAL | Status: DC

## 2016-02-29 NOTE — Telephone Encounter (Signed)
Pt notified Rx ready for pickup. Rx printed and signed.  

## 2016-03-24 ENCOUNTER — Telehealth: Payer: Self-pay | Admitting: Internal Medicine

## 2016-03-24 NOTE — Telephone Encounter (Signed)
Pt request refill  amphetamine-dextroamphetamine (ADDERALL) 10 MG tablet 3 mo supply  Also Pt request refill  HYDROcodone-acetaminophen (NORCO/VICODIN) 5-325 MG tablet  Pt knows it is early, but hopes he can pick up with the adderall for one trip.

## 2016-03-24 NOTE — Telephone Encounter (Signed)
Okay to refill both prescriptions

## 2016-03-25 MED ORDER — HYDROCODONE-ACETAMINOPHEN 5-325 MG PO TABS: ORAL_TABLET | ORAL | 0 refills | Status: DC

## 2016-03-25 MED ORDER — AMPHETAMINE-DEXTROAMPHETAMINE 10 MG PO TABS: ORAL_TABLET | ORAL | 0 refills | Status: DC

## 2016-03-25 MED ORDER — AMPHETAMINE-DEXTROAMPHETAMINE 10 MG PO TABS: 10.0000 mg | ORAL_TABLET | Freq: Three times a day (TID) | ORAL | 0 refills | Status: DC | PRN

## 2016-03-25 NOTE — Telephone Encounter (Signed)
Pt aware Rx is ready. Rx's placed up front.

## 2016-03-25 NOTE — Telephone Encounter (Signed)
Rx's printed for signature.  

## 2016-04-23 ENCOUNTER — Telehealth: Payer: Self-pay | Admitting: Internal Medicine

## 2016-04-23 NOTE — Telephone Encounter (Signed)
Pt needs new rx hydrocodone °

## 2016-04-25 MED ORDER — HYDROCODONE-ACETAMINOPHEN 5-325 MG PO TABS: ORAL_TABLET | ORAL | 0 refills | Status: DC

## 2016-04-25 NOTE — Telephone Encounter (Signed)
Left message on voicemail Rx ready for pickup will be at the front desk. Rx printed and signed.  

## 2016-05-22 ENCOUNTER — Telehealth: Payer: Self-pay | Admitting: Internal Medicine

## 2016-05-22 NOTE — Telephone Encounter (Signed)
° ° ° ° ° °  Pt request refill of the following: ° °HYDROcodone-acetaminophen (NORCO/VICODIN) 5-325 MG tablet ° ° °Phamacy: °

## 2016-05-23 MED ORDER — HYDROCODONE-ACETAMINOPHEN 5-325 MG PO TABS: ORAL_TABLET | ORAL | 0 refills | Status: DC

## 2016-05-23 NOTE — Telephone Encounter (Signed)
Left message on voicemail Rx ready for pickup, will be at the front desk. Rx printed and signed. 

## 2016-06-20 ENCOUNTER — Telehealth: Payer: Self-pay | Admitting: Internal Medicine

## 2016-06-20 MED ORDER — AMPHETAMINE-DEXTROAMPHETAMINE 10 MG PO TABS: 10.0000 mg | ORAL_TABLET | Freq: Three times a day (TID) | ORAL | 0 refills | Status: DC | PRN

## 2016-06-20 MED ORDER — HYDROCODONE-ACETAMINOPHEN 5-325 MG PO TABS: ORAL_TABLET | ORAL | 0 refills | Status: DC

## 2016-06-20 NOTE — Telephone Encounter (Signed)
Pt needs new rx generic adderall xr 10 mg and hydrocodone

## 2016-06-20 NOTE — Telephone Encounter (Signed)
Pt notified Rx's ready for pickup. Rx's printed and signed by Dr. Fry.  

## 2016-07-16 ENCOUNTER — Other Ambulatory Visit: Payer: Self-pay | Admitting: Internal Medicine

## 2016-07-22 ENCOUNTER — Telehealth: Payer: Self-pay | Admitting: Internal Medicine

## 2016-07-22 NOTE — Telephone Encounter (Signed)
Pt request refill  °HYDROcodone-acetaminophen (NORCO/VICODIN) 5-325 MG tablet °

## 2016-07-23 MED ORDER — HYDROCODONE-ACETAMINOPHEN 5-325 MG PO TABS: ORAL_TABLET | ORAL | 0 refills | Status: DC

## 2016-07-23 NOTE — Telephone Encounter (Signed)
Pt notified Rx ready for pickup. Rx printed and signed.  

## 2016-08-13 ENCOUNTER — Telehealth: Payer: Self-pay | Admitting: Internal Medicine

## 2016-08-13 NOTE — Telephone Encounter (Signed)
Pt request refill  ALPRAZolam (XANAX) 0.5 MG tablet [phone] - [place], [place] - [address] AT [facility] [address]  Pt realizes it is early for his  HYDROcodone-acetaminophen (NORCO/VICODIN) 5-325 MG tablet But would like to request as well

## 2016-08-13 NOTE — Telephone Encounter (Signed)
Okay to refill controlled medications  when due , but do not refill fill early

## 2016-08-14 NOTE — Telephone Encounter (Signed)
Xanax 0.5.mg refilled.

## 2016-08-20 ENCOUNTER — Other Ambulatory Visit: Payer: Self-pay | Admitting: Internal Medicine

## 2016-08-20 MED ORDER — HYDROCODONE-ACETAMINOPHEN 5-325 MG PO TABS: ORAL_TABLET | ORAL | 0 refills | Status: DC

## 2016-08-20 NOTE — Telephone Encounter (Signed)
HYDROcodone-acetaminophen refilled .

## 2016-09-12 ENCOUNTER — Telehealth: Payer: Self-pay | Admitting: Internal Medicine

## 2016-09-12 ENCOUNTER — Other Ambulatory Visit: Payer: Self-pay | Admitting: Internal Medicine

## 2016-09-12 MED ORDER — ALPRAZOLAM 0.5 MG PO TABS: 0.5000 mg | ORAL_TABLET | Freq: Three times a day (TID) | ORAL | 0 refills | Status: DC | PRN

## 2016-09-12 NOTE — Telephone Encounter (Signed)
Pt request refill  ALPRAZolam (XANAX) 0.5 MG tablet  [facility] [address]  [place]

## 2016-09-12 NOTE — Telephone Encounter (Signed)
[facility]

## 2016-09-18 ENCOUNTER — Other Ambulatory Visit: Payer: Self-pay | Admitting: Internal Medicine

## 2016-09-18 ENCOUNTER — Telehealth: Payer: Self-pay | Admitting: Internal Medicine

## 2016-09-18 MED ORDER — AMPHETAMINE-DEXTROAMPHETAMINE 10 MG PO TABS: 10.0000 mg | ORAL_TABLET | Freq: Three times a day (TID) | ORAL | 0 refills | Status: DC | PRN

## 2016-09-18 MED ORDER — HYDROCODONE-ACETAMINOPHEN 5-325 MG PO TABS: ORAL_TABLET | ORAL | 0 refills | Status: DC

## 2016-09-18 NOTE — Telephone Encounter (Signed)
Pt needs new rx for generic adderall 10 mg and hydrocodone

## 2016-09-18 NOTE — Telephone Encounter (Signed)
Rx was printed and is waiting to be signed tomorrow (09/19/16) when Dr. Kirtland BouchardK returns to the office.

## 2016-09-19 NOTE — Telephone Encounter (Signed)
Pt notified Rx ready for pickup. Rx printed and signed.  

## 2016-10-14 ENCOUNTER — Telehealth: Payer: Self-pay | Admitting: Internal Medicine

## 2016-10-14 NOTE — Telephone Encounter (Signed)
° ° ° °  Pt request refill of the following:  ALPRAZolam (XANAX) 0.5 MG tablet   [facility] [place]

## 2016-10-15 MED ORDER — ALPRAZOLAM 0.5 MG PO TABS: 0.5000 mg | ORAL_TABLET | Freq: Three times a day (TID) | ORAL | 0 refills | Status: DC | PRN

## 2016-10-15 NOTE — Telephone Encounter (Signed)
Medication refill was phoned in to pharmacy.  

## 2016-10-21 ENCOUNTER — Telehealth: Payer: Self-pay | Admitting: Internal Medicine

## 2016-10-21 NOTE — Telephone Encounter (Signed)
Pt has not had a OV in 6 months, appointment made for 10/28/16. Will refill medication at that time.

## 2016-10-21 NOTE — Telephone Encounter (Signed)
° ° ° ° ° °  Pt request refill of the following: ° °HYDROcodone-acetaminophen (NORCO/VICODIN) 5-325 MG tablet ° ° °Phamacy: °

## 2016-10-28 ENCOUNTER — Telehealth: Payer: Self-pay | Admitting: Internal Medicine

## 2016-10-28 ENCOUNTER — Ambulatory Visit (INDEPENDENT_AMBULATORY_CARE_PROVIDER_SITE_OTHER): Payer: BLUE CROSS/BLUE SHIELD | Admitting: Internal Medicine

## 2016-10-28 ENCOUNTER — Encounter: Payer: Self-pay | Admitting: Internal Medicine

## 2016-10-28 VITALS — BP 142/88 | HR 86 | Temp 98.2°F | Ht 66.5 in | Wt 190.4 lb

## 2016-10-28 DIAGNOSIS — F900 Attention-deficit hyperactivity disorder, predominantly inattentive type: Secondary | ICD-10-CM | POA: Diagnosis not present

## 2016-10-28 DIAGNOSIS — J3089 Other allergic rhinitis: Secondary | ICD-10-CM | POA: Diagnosis not present

## 2016-10-28 MED ORDER — AMPHETAMINE-DEXTROAMPHETAMINE 10 MG PO TABS: 10.0000 mg | ORAL_TABLET | Freq: Three times a day (TID) | ORAL | 0 refills | Status: DC | PRN

## 2016-10-28 MED ORDER — ALPRAZOLAM 0.5 MG PO TABS: 0.5000 mg | ORAL_TABLET | Freq: Three times a day (TID) | ORAL | 0 refills | Status: DC | PRN

## 2016-10-28 MED ORDER — HYDROCODONE-ACETAMINOPHEN 5-325 MG PO TABS: ORAL_TABLET | ORAL | 0 refills | Status: DC

## 2016-10-28 MED ORDER — SERTRALINE HCL 50 MG PO TABS: 50.0000 mg | ORAL_TABLET | Freq: Every day | ORAL | 3 refills | Status: DC

## 2016-10-28 NOTE — Progress Notes (Signed)
Pre visit review using our clinic review tool, if applicable. No additional management support is needed unless otherwise documented below in the visit note. 
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
## 2016-10-28 NOTE — Patient Instructions (Signed)
Limit your sodium (Salt) intake    It is important that you exercise regularly, at least 20 minutes 3 to 4 times per week.  If you develop chest pain or shortness of breath seek  medical attention. 

## 2016-10-28 NOTE — Telephone Encounter (Signed)
Pt notified Rx ready for pickup. Rx printed and signed.  

## 2016-10-28 NOTE — Telephone Encounter (Signed)
° ° ° ° ° °  Pt request refill of the following: ° °HYDROcodone-acetaminophen (NORCO/VICODIN) 5-325 MG tablet ° ° °Phamacy: °

## 2016-11-27 ENCOUNTER — Telehealth: Payer: Self-pay | Admitting: Internal Medicine

## 2016-11-27 NOTE — Telephone Encounter (Signed)
Pt need new Rx for Hydrocodone  Pt is aware 3 business days for refills and that Dr. Kirtland Bouchard is out of the office and will be returning on 4/9 and someone will give him a call when ready for pick-up.

## 2016-12-01 MED ORDER — HYDROCODONE-ACETAMINOPHEN 5-325 MG PO TABS: ORAL_TABLET | ORAL | 0 refills | Status: DC

## 2016-12-01 NOTE — Telephone Encounter (Signed)
Rx printed awaiting to be signed.  

## 2016-12-03 NOTE — Telephone Encounter (Signed)
Pt notified Rx ready for pickup. Rx printed and signed.  

## 2016-12-12 ENCOUNTER — Telehealth: Payer: Self-pay | Admitting: Internal Medicine

## 2016-12-12 NOTE — Telephone Encounter (Signed)
Pt request refill  ALPRAZolam (XANAX) 0.5 MG tablet  [id number] - [place],  - [address] AT [facility] [address] [facility] [address] [phone] (Phone) [phone] (Fax)

## 2016-12-12 NOTE — Telephone Encounter (Signed)
Medication refilled

## 2016-12-25 ENCOUNTER — Telehealth: Payer: Self-pay | Admitting: Internal Medicine

## 2016-12-25 NOTE — Telephone Encounter (Signed)
° ° ° ° ° °  Pt request refill of the following: ° °HYDROcodone-acetaminophen (NORCO/VICODIN) 5-325 MG tablet ° ° °Phamacy: °

## 2016-12-26 NOTE — Telephone Encounter (Signed)
Last refilled Rx on 12/01/16, too early to refill. Will print Rx on 12/31/16.

## 2016-12-30 NOTE — Telephone Encounter (Signed)
Pt calling to check the status of the Rx and is aware that it will not be available until after the 12/31/16.

## 2017-01-07 MED ORDER — HYDROCODONE-ACETAMINOPHEN 5-325 MG PO TABS: ORAL_TABLET | ORAL | 0 refills | Status: DC

## 2017-01-07 NOTE — Addendum Note (Signed)
Addended by: Neville RouteJAIMES, PATRICIA G on: 01/07/2017 10:55 AM   Modules accepted: Orders

## 2017-01-07 NOTE — Telephone Encounter (Signed)
Pt notified Rx ready for pickup. Rx printed and signed.  

## 2017-01-12 ENCOUNTER — Telehealth: Payer: Self-pay | Admitting: Internal Medicine

## 2017-01-12 MED ORDER — ALPRAZOLAM 0.5 MG PO TABS: 0.5000 mg | ORAL_TABLET | Freq: Three times a day (TID) | ORAL | 0 refills | Status: DC | PRN

## 2017-01-12 NOTE — Telephone Encounter (Signed)
° ° °  Pt request refill of the following:  ALPRAZolam Shaun Barron(XANAX) 0.5 MG tablet   [facility] [place]

## 2017-01-12 NOTE — Telephone Encounter (Signed)
Medication was refilled.

## 2017-02-05 ENCOUNTER — Telehealth: Payer: Self-pay | Admitting: Internal Medicine

## 2017-02-05 NOTE — Telephone Encounter (Signed)
° ° ° ° ° °  Pt request refill of the following: ° °HYDROcodone-acetaminophen (NORCO/VICODIN) 5-325 MG tablet ° ° °Phamacy: °

## 2017-02-05 NOTE — Telephone Encounter (Signed)
Last refilled 01/07/17, too soon to refill may refill Rx after 02/07/17. Rx must last 30 days.

## 2017-02-09 ENCOUNTER — Telehealth: Payer: Self-pay | Admitting: Internal Medicine

## 2017-02-09 MED ORDER — HYDROCODONE-ACETAMINOPHEN 5-325 MG PO TABS: ORAL_TABLET | ORAL | 0 refills | Status: DC

## 2017-02-09 NOTE — Telephone Encounter (Signed)
Last refilled medication on 01/12/17. Rx must last 30 days too early to fill.

## 2017-02-09 NOTE — Telephone Encounter (Signed)
Pt request refill   ALPRAZolam (XANAX) 0.5 MG tablet  [phone] - [place],  - [address] [facility] [address]

## 2017-02-09 NOTE — Telephone Encounter (Signed)
Rx is printed awaiting to be signed.  

## 2017-02-16 ENCOUNTER — Telehealth: Payer: Self-pay | Admitting: Internal Medicine

## 2017-02-16 NOTE — Telephone Encounter (Signed)
° ° °  Pt scheduled for a physical in July and is asking for a refill    ALPRAZolam (XANAX) 0.5 MG tablet    [facility] [place]

## 2017-02-19 ENCOUNTER — Telehealth: Payer: Self-pay | Admitting: Internal Medicine

## 2017-02-19 ENCOUNTER — Other Ambulatory Visit: Payer: Self-pay | Admitting: Internal Medicine

## 2017-02-19 MED ORDER — AMPHETAMINE-DEXTROAMPHETAMINE 10 MG PO TABS: 10.0000 mg | ORAL_TABLET | Freq: Three times a day (TID) | ORAL | 0 refills | Status: DC | PRN

## 2017-02-19 MED ORDER — ALPRAZOLAM 0.5 MG PO TABS: 0.5000 mg | ORAL_TABLET | Freq: Three times a day (TID) | ORAL | 0 refills | Status: DC | PRN

## 2017-02-19 NOTE — Telephone Encounter (Signed)
  Rx printed awaiting MD signature 

## 2017-02-19 NOTE — Addendum Note (Signed)
Addended by: Neville RouteJAIMES, PATRICIA G on: 02/19/2017 02:27 PM   Modules accepted: Orders

## 2017-02-19 NOTE — Telephone Encounter (Signed)
° ° ° °  Pt request refill of the following: ° °amphetamine-dextroamphetamine (ADDERALL) 10 MG tablet ° ° °Phamacy: °

## 2017-02-20 NOTE — Telephone Encounter (Signed)
Pt notified Rx ready for pickup. Rx printed and signed.  

## 2017-03-17 ENCOUNTER — Ambulatory Visit (INDEPENDENT_AMBULATORY_CARE_PROVIDER_SITE_OTHER): Payer: BLUE CROSS/BLUE SHIELD | Admitting: Internal Medicine

## 2017-03-17 ENCOUNTER — Other Ambulatory Visit: Payer: Self-pay | Admitting: Internal Medicine

## 2017-03-17 ENCOUNTER — Encounter: Payer: Self-pay | Admitting: Internal Medicine

## 2017-03-17 VITALS — BP 130/84 | HR 98 | Temp 98.0°F | Wt 190.6 lb

## 2017-03-17 DIAGNOSIS — Z0000 Encounter for general adult medical examination without abnormal findings: Secondary | ICD-10-CM | POA: Diagnosis not present

## 2017-03-17 LAB — POCT GLYCOSYLATED HEMOGLOBIN (HGB A1C): Hemoglobin A1C: 6

## 2017-03-17 MED ORDER — SERTRALINE HCL 50 MG PO TABS: 50.0000 mg | ORAL_TABLET | Freq: Every day | ORAL | 3 refills | Status: DC

## 2017-03-17 MED ORDER — AMPHETAMINE-DEXTROAMPHETAMINE 10 MG PO TABS: 10.0000 mg | ORAL_TABLET | Freq: Three times a day (TID) | ORAL | 0 refills | Status: DC | PRN

## 2017-03-17 MED ORDER — HYDROCODONE-ACETAMINOPHEN 5-325 MG PO TABS: ORAL_TABLET | ORAL | 0 refills | Status: DC

## 2017-03-17 MED ORDER — ALPRAZOLAM 0.5 MG PO TABS: 0.5000 mg | ORAL_TABLET | Freq: Three times a day (TID) | ORAL | 0 refills | Status: DC | PRN

## 2017-03-17 NOTE — Addendum Note (Signed)
Addended by: Carola RhineKIGOTHO, NANCY N on: 03/17/2017 04:38 PM   Modules accepted: Orders

## 2017-03-17 NOTE — Patient Instructions (Signed)
It is important that you exercise regularly, at least 20 minutes 3 to 4 times per week.  If you develop chest pain or shortness of breath seek  medical attention.  You need to lose weight.  Consider a lower calorie diet and regular exercise.  Return in one year for follow-up   

## 2017-03-17 NOTE — Progress Notes (Signed)
Subjective:    Patient ID: Shaun Barron, male    DOB: 1976/05/26, 41 y.o.   MRN: [id number]  HPI 41 year old patient who is seen today for a preventive health examination. He has a history of ADD and has done quite well on present medication.  His son also takes Adderall for ADD No concerns today.  Over the last 2 years, he has had some mild impaired glucose tolerance. Does have a history mild allergic rhinitis and anxiety disorder.  Family history.  Father status post CABG and also surgery for bilateral carotid artery stenosis.  History of late onset diabetes.  Mother history of multiple myeloma died of septic complications apparently after attaining remission.  One stepbrother and one stepsister in good health  Past Medical History:  Diagnosis Date  . ALLERGIC RHINITIS 10/09/2008  . ANXIETY 10/09/2008  . GYNECOMASTIA 04/16/2010  . SHOULDER PAIN, LEFT, CHRONIC 10/19/2008     Social History   Social History  . Marital status: Married    Spouse name: N/A  . Number of children: N/A  . 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 Smoking status: Former Smoker    Quit date: 09/25/2005  . Smokeless tobacco: Never Used  . Alcohol use Yes     Comment: rarely  . Drug use: No  . Sexual activity: Not on file   Other Topics Concern  . Not on file   Social History Narrative   One step sister and one step brother, both in good health      45 year old son, 75-year old daughter    Past Surgical History:  Procedure Laterality Date  . LASIK      Family History  Problem Relation Age of Onset  . Coronary artery disease Father        bilat carotid artery stenosis:s/p CABG  . Cancer Mother        hx multiple myeloma-status post stem transpantation July 2008 and presently in remission    No Known Allergies  Current Outpatient Prescriptions on File Prior to Visit  Medication Sig Dispense Refill  . ALPRAZolam  (XANAX) 0.5 MG tablet Take 1 tablet (0.5 mg total) by mouth 3 (three) times daily as needed. 60 tablet 0  . amphetamine-dextroamphetamine (ADDERALL) 10 MG tablet Take 1 tablet (10 mg total) by mouth 3 (three) times daily as needed. 90 tablet 0  . amphetamine-dextroamphetamine (ADDERALL) 10 MG tablet Take 1 tablet (10 mg total) by mouth 3 (three) times daily as needed. 90 tablet 0  . amphetamine-dextroamphetamine (ADDERALL) 10 MG tablet Take 1 tablet (10 mg total) by mouth 3 (three) times daily as needed. 90 tablet 0  . HYDROcodone-acetaminophen (NORCO/VICODIN) 5-325 MG tablet TAKE ONE TABLET BY MOUTH EVERY 6 HOURS AS NEEDED FOR PAIN 30 tablet 0  . sertraline (ZOLOFT) 50 MG tablet Take 1 tablet (50 mg total) by mouth daily. 90 tablet 3  . Triamcinolone Acetonide (NASACORT AQ NA) Place 1 spray into the nose daily. Reported on 12/31/2015     No current facility-administered medications on file prior to visit.     BP 132/90 (BP Location: Left Arm, Patient Position: Sitting, Cuff Size: Normal)   Pulse 98   Temp 98 F (36.7 C) (Oral)   Wt 190 lb 9.6 oz (86.5 kg)   SpO2 98%   BMI 30.30 kg/m      Review of Systems  Constitutional: Negative for appetite change,  chills, fatigue and fever.  HENT: Negative for congestion, dental problem, ear pain, hearing loss, sore throat, tinnitus, trouble swallowing and voice change.   Eyes: Negative for pain, discharge and visual disturbance.  Respiratory: Negative for cough, chest tightness, wheezing and stridor.   Cardiovascular: Negative for chest pain, palpitations and leg swelling.  Gastrointestinal: Negative for abdominal distention, abdominal pain, blood in stool, constipation, diarrhea, nausea and vomiting.  Genitourinary: Negative for difficulty urinating, discharge, flank pain, genital sores, hematuria and urgency.  Musculoskeletal: Negative for arthralgias, back pain, gait problem, joint swelling, myalgias and neck stiffness.  Skin: Negative for  rash.  Neurological: Negative for dizziness, syncope, speech difficulty, weakness, numbness and headaches.  Hematological: Negative for adenopathy. Does not bruise/bleed easily.  Psychiatric/Behavioral: Negative for behavioral problems and dysphoric mood. The patient is not nervous/anxious.        Objective:   Physical Exam  Constitutional: He is oriented to person, place, and time. He appears well-developed.  Weight 190 Blood pressure 130/84  HENT:  Head: Normocephalic.  Right Ear: External ear normal.  Left Ear: External ear normal.  Pharyngeal crowding  Eyes: Conjunctivae and EOM are normal.  Neck: Normal range of motion.  Cardiovascular: Normal rate and normal heart sounds.   Pulmonary/Chest: Breath sounds normal.  Abdominal: Bowel sounds are normal.  Musculoskeletal: Normal range of motion. He exhibits no edema or tenderness.  Neurological: He is alert and oriented to person, place, and time.  Skin:  2-3 mm nonspecific irritated papule just lateral to the left border of the nose  Psychiatric: He has a normal mood and affect. His behavior is normal.          Assessment & Plan:   Preventive health examination ADHD. Medications updated Impaired glucose tolerance.  We'll check hemoglobin A1c Anxiety disorder.  Alprazolam.  Updated.  Sertraline.  Updated  Follow-up one year or as needed Additional weight loss recommended  Nyoka Cowden

## 2017-04-16 ENCOUNTER — Telehealth: Payer: Self-pay | Admitting: Internal Medicine

## 2017-04-16 NOTE — Telephone Encounter (Signed)
Pt needs new rx hydrocodone. Pt is aware md out of office today °

## 2017-04-17 MED ORDER — HYDROCODONE-ACETAMINOPHEN 5-325 MG PO TABS: ORAL_TABLET | ORAL | 0 refills | Status: DC

## 2017-04-17 NOTE — Telephone Encounter (Signed)
Rx printed awaiting to be signed.  

## 2017-04-17 NOTE — Telephone Encounter (Signed)
Pt notified Rx ready for pickup. Rx printed and signed.  

## 2017-04-20 NOTE — Telephone Encounter (Signed)
Pt brought ID signed log and picked up script. cb °

## 2017-04-21 ENCOUNTER — Telehealth: Payer: Self-pay | Admitting: Internal Medicine

## 2017-04-21 MED ORDER — ALPRAZOLAM 0.5 MG PO TABS: 0.5000 mg | ORAL_TABLET | Freq: Three times a day (TID) | ORAL | 0 refills | Status: DC | PRN

## 2017-04-21 NOTE — Telephone Encounter (Signed)
°  Pt had physical last month     Pt request refill of the following:  ALPRAZolam (XANAX) 0.5 MG tablet   Phamacy:  [facility]

## 2017-04-21 NOTE — Telephone Encounter (Signed)
Rx was refilled as requested per pt.

## 2017-05-14 ENCOUNTER — Encounter: Payer: Self-pay | Admitting: Internal Medicine

## 2017-05-20 ENCOUNTER — Telehealth: Payer: Self-pay | Admitting: Internal Medicine

## 2017-05-20 NOTE — Telephone Encounter (Signed)
Pt needs new rx hydrocodone °

## 2017-05-21 MED ORDER — HYDROCODONE-ACETAMINOPHEN 5-325 MG PO TABS: ORAL_TABLET | ORAL | 0 refills | Status: DC

## 2017-05-21 NOTE — Telephone Encounter (Signed)
Rx printed awaiting to be signed.  

## 2017-05-22 ENCOUNTER — Other Ambulatory Visit: Payer: Self-pay | Admitting: Internal Medicine

## 2017-05-22 NOTE — Telephone Encounter (Signed)
Pt notified Rx ready for pickup. Rx printed and signed.  

## 2017-06-03 ENCOUNTER — Other Ambulatory Visit: Payer: Self-pay | Admitting: Internal Medicine

## 2017-06-23 ENCOUNTER — Other Ambulatory Visit: Payer: Self-pay | Admitting: Internal Medicine

## 2017-06-25 ENCOUNTER — Encounter: Payer: Self-pay | Admitting: Internal Medicine

## 2017-06-25 ENCOUNTER — Ambulatory Visit (INDEPENDENT_AMBULATORY_CARE_PROVIDER_SITE_OTHER): Payer: BLUE CROSS/BLUE SHIELD | Admitting: Internal Medicine

## 2017-06-25 VITALS — BP 142/80 | HR 83 | Temp 98.4°F | Ht 66.5 in | Wt 192.4 lb

## 2017-06-25 DIAGNOSIS — M25512 Pain in left shoulder: Secondary | ICD-10-CM | POA: Diagnosis not present

## 2017-06-25 DIAGNOSIS — Z79899 Other long term (current) drug therapy: Secondary | ICD-10-CM

## 2017-06-25 DIAGNOSIS — G8929 Other chronic pain: Secondary | ICD-10-CM

## 2017-06-25 MED ORDER — HYDROCODONE-ACETAMINOPHEN 5-325 MG PO TABS: ORAL_TABLET | ORAL | 0 refills | Status: DC

## 2017-06-25 NOTE — Patient Instructions (Signed)
Call or return to clinic prn if these symptoms worsen or fail to improve as anticipated.

## 2017-06-25 NOTE — Telephone Encounter (Signed)
Okay to refill? 

## 2017-06-25 NOTE — Progress Notes (Signed)
   Subjective:    Patient ID: Shaun Barron, male    DOB: 1975/08/31, 41 y.o.   MRN: [id number]  HPI  41 year old patient who is seen today for chronic pain management per opioid protocol.  NCCSRS database printed, initialed  and scanned into the EMR.  Indication for chronic opioid: patient takes hydrocodone periodically due to chronic left shoulder pain.  He also has occasional lumbar pain. Medication and dose: hydrocodone 5-acetaminophen 325 # pills per month: 30 Last UDS date: 06/25/2017 Pain contract signed (Y/N): yes Date narcotic database last reviewed (include red flags): 06/25/2017  Review of Systems     Objective:   Physical Exam        Assessment & Plan:    Encounter for chronic pain management (G89.29) Narcotic use  (711.90) Pain management contract signed (Z02.89)  Rogelia BogaKWIATKOWSKI,Laquentin Loudermilk FRANK

## 2017-06-30 LAB — PAIN MGMT, PROFILE 8 W/CONF, U
6 ACETYLMORPHINE: NEGATIVE ng/mL (ref <10)
ALCOHOL METABOLITES: NEGATIVE ng/mL (ref <500)
ALPHAHYDROXYALPRAZOLAM: NEGATIVE ng/mL (ref <25)
ALPHAHYDROXYTRIAZOLAM: NEGATIVE ng/mL (ref <50)
AMINOCLONAZEPAM: NEGATIVE ng/mL (ref <25)
Alphahydroxymidazolam: NEGATIVE ng/mL (ref <50)
Amphetamine: 1861 ng/mL — ABNORMAL HIGH (ref <250)
Amphetamines: POSITIVE ng/mL — AB (ref <500)
BENZODIAZEPINES: NEGATIVE ng/mL (ref <100)
Buprenorphine, Urine: NEGATIVE ng/mL (ref <5)
CREATININE: 15.2 mg/dL — AB
Cocaine Metabolite: NEGATIVE ng/mL (ref <150)
Hydroxyethylflurazepam: NEGATIVE ng/mL (ref <50)
LORAZEPAM: NEGATIVE ng/mL (ref <50)
MARIJUANA METABOLITE: NEGATIVE ng/mL (ref <20)
MDMA: NEGATIVE ng/mL (ref <500)
Methamphetamine: NEGATIVE ng/mL (ref <250)
Nordiazepam: NEGATIVE ng/mL (ref <50)
OPIATES: NEGATIVE ng/mL (ref <100)
OXAZEPAM: NEGATIVE ng/mL (ref <50)
Oxidant: NEGATIVE ug/mL (ref <200)
Oxycodone: NEGATIVE ng/mL (ref <100)
SPECIFIC GRAVITY: 1.002 — AB (ref >=1.0)
TEMAZEPAM: NEGATIVE ng/mL (ref <50)
pH: 6.6 (ref 4.5–9.0)

## 2017-07-27 ENCOUNTER — Telehealth: Payer: Self-pay | Admitting: Internal Medicine

## 2017-07-29 ENCOUNTER — Other Ambulatory Visit: Payer: Self-pay | Admitting: Internal Medicine

## 2017-08-05 NOTE — Telephone Encounter (Signed)
[facility] his rx  ALPRAZolam Prudy Feeler(XANAX) 0.5 MG tablet  [facility] - [place], [place] - [phone] (Phone) [phone] (Fax)   Can you resend please?

## 2017-08-05 NOTE — Telephone Encounter (Signed)
Called [facility] and confirmed they did NOT receive rx on 07/28/17. Medication re-faxed to pharmacy as requested. Fax confirmation received.

## 2017-08-10 ENCOUNTER — Telehealth: Payer: Self-pay | Admitting: Internal Medicine

## 2017-08-10 ENCOUNTER — Other Ambulatory Visit: Payer: Self-pay | Admitting: Internal Medicine

## 2017-08-10 NOTE — Telephone Encounter (Signed)
Copied from CRM [id number]. Topic: Quick Communication - See Telephone Encounter >> Aug 10, 2017  2:19 PM Cipriano BunkerLambe, Annette S wrote: CRM for notification. See Telephone encounter for:  [facility]   [facility] - [place], [place] - [id number] [place] [id number] Phone: [phone] Fax: [phone] se resend prescription as [facility] not receive.   It is showing in chart called in 2 times.  [facility] is still saying no  08/10/17.

## 2017-08-11 NOTE — Telephone Encounter (Signed)
There are 2 fax confirmations that Rx was faxed to [facility] pharmacy. I spoke with the pharmacy and they last Rx they have on file is for 06/26/17.  Spoke with the pharmacist and a verbal order was given Pt was called and informed that pharmacy received the refill.

## 2017-09-10 ENCOUNTER — Other Ambulatory Visit: Payer: Self-pay | Admitting: Internal Medicine

## 2017-09-21 ENCOUNTER — Telehealth: Payer: Self-pay | Admitting: Internal Medicine

## 2017-09-21 NOTE — Telephone Encounter (Signed)
Pt  Requesting  A  Refill  Off adderall      LOV   06/25/2017           PHARMACY    [facility]   STORE NUMBER   [id number]  [address]   [place]

## 2017-09-21 NOTE — Telephone Encounter (Signed)
Copied from CRM [phone] [phone]. Topic: Quick Communication - Rx Refill/Question >> Sep 21, 2017  3:57 PM Viviann SpareWhite, Selina wrote: Medication: amphetamine-dextroamphetamine (ADDERALL) 10 MG tablet    Has the patient contacted their pharmacy? Yes.     (Agent: If no, request that the patient contact the pharmacy for the refill.)   Preferred Pharmacy (with phone number or street name):   [id number] - [place], [place] - [address] [facility] [address] [place] [id number] Phone: [phone] Fax: [phone]    Agent: Please be advised that RX refills may take up to 3 business days. We ask that you follow-up with your pharmacy.

## 2017-09-28 ENCOUNTER — Other Ambulatory Visit: Payer: Self-pay | Admitting: Family Medicine

## 2017-09-28 MED ORDER — AMPHETAMINE-DEXTROAMPHETAMINE 10 MG PO TABS: 10.0000 mg | ORAL_TABLET | Freq: Three times a day (TID) | ORAL | 0 refills | Status: DC | PRN

## 2017-09-28 NOTE — Telephone Encounter (Signed)
Medication refill was printed and signed by Md. Pt was made aware.

## 2017-10-19 ENCOUNTER — Telehealth: Payer: Self-pay | Admitting: Internal Medicine

## 2017-10-19 NOTE — Telephone Encounter (Signed)
Dr K pt 

## 2017-10-22 DIAGNOSIS — S8991XA Unspecified injury of right lower leg, initial encounter: Secondary | ICD-10-CM | POA: Diagnosis not present

## 2017-10-22 DIAGNOSIS — M1711 Unilateral primary osteoarthritis, right knee: Secondary | ICD-10-CM | POA: Diagnosis not present

## 2017-10-26 ENCOUNTER — Other Ambulatory Visit: Payer: Self-pay | Admitting: Internal Medicine

## 2017-10-26 NOTE — Telephone Encounter (Signed)
Copied from CRM [phone]. Topic: General - Other >> Oct 26, 2017  3:40 PM Cecelia ByarsGreen, Temeka L, RMA wrote: Reason for CRM: Medication refill request for amphetamine-dextroamphetamine (ADDERALL) 10 MG [facility] [place]

## 2017-10-27 NOTE — Telephone Encounter (Signed)
LOV: 06/25/17  D. Amador CunasKwiatkowski  [facility] [place]

## 2017-10-29 MED ORDER — AMPHETAMINE-DEXTROAMPHETAMINE 10 MG PO TABS: 10.0000 mg | ORAL_TABLET | Freq: Three times a day (TID) | ORAL | 0 refills | Status: DC | PRN

## 2017-10-29 NOTE — Telephone Encounter (Unsigned)
Copied from CRM [id number]. Topic: General - Other >> Oct 26, 2017  3:40 PM Cecelia ByarsGreen, Temeka L, RMA wrote: Reason for CRM: Medication refill request for amphetamine-dextroamphetamine (ADDERALL) 10 MG tablet [facility] [place]   >> Oct 29, 2017  3:44 PM Raquel SarnaHayes, Teresa G wrote: Pt needing the adderall Rx filled amphetamine-dextroamphetamine (ADDERALL) 10 MG- .  He requested it on 10-26-17.

## 2017-10-29 NOTE — Telephone Encounter (Signed)
Pt [facility] [place]. [facility] [place] [facility].

## 2017-10-30 NOTE — Telephone Encounter (Signed)
30 day supply of medication filled by Dr. Clent RidgesFry in PCP's absence. Patient will need to pick up hard copy of rx in office.  Called patient and left message that rx is ready for pick up.

## 2017-11-03 ENCOUNTER — Telehealth: Payer: Self-pay | Admitting: Internal Medicine

## 2017-11-03 NOTE — Telephone Encounter (Signed)
Noted routing to Dr.Kwiatkowski 

## 2017-11-03 NOTE — Telephone Encounter (Signed)
Patient dropped off annual Age Appropriate Wellness Examination form  Call patient for pick up at: [phone]  Disposition: Dr's Folder

## 2017-11-19 ENCOUNTER — Other Ambulatory Visit: Payer: Self-pay | Admitting: Internal Medicine

## 2017-11-19 NOTE — Telephone Encounter (Signed)
Alprazolam voided accidentally printed.

## 2017-12-08 ENCOUNTER — Encounter: Payer: Self-pay | Admitting: Internal Medicine

## 2017-12-08 ENCOUNTER — Ambulatory Visit (INDEPENDENT_AMBULATORY_CARE_PROVIDER_SITE_OTHER): Payer: BLUE CROSS/BLUE SHIELD | Admitting: Internal Medicine

## 2017-12-08 VITALS — BP 122/78 | HR 90 | Temp 98.8°F

## 2017-12-08 DIAGNOSIS — F900 Attention-deficit hyperactivity disorder, predominantly inattentive type: Secondary | ICD-10-CM

## 2017-12-08 DIAGNOSIS — M25512 Pain in left shoulder: Secondary | ICD-10-CM | POA: Diagnosis not present

## 2017-12-08 DIAGNOSIS — R52 Pain, unspecified: Secondary | ICD-10-CM | POA: Diagnosis not present

## 2017-12-08 MED ORDER — AMPHETAMINE-DEXTROAMPHETAMINE 10 MG PO TABS: 10.0000 mg | ORAL_TABLET | Freq: Three times a day (TID) | ORAL | 0 refills | Status: DC | PRN

## 2017-12-08 MED ORDER — HYDROCODONE-ACETAMINOPHEN 5-325 MG PO TABS: ORAL_TABLET | ORAL | 0 refills | Status: DC
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
## 2017-12-08 NOTE — Patient Instructions (Signed)
Return in 3 months for follow-up  Please call for orthopedic referral if right knee pain fails to improve

## 2017-12-21 ENCOUNTER — Other Ambulatory Visit: Payer: Self-pay | Admitting: Internal Medicine

## 2018-01-21 ENCOUNTER — Telehealth: Payer: Self-pay | Admitting: Internal Medicine

## 2018-01-26 NOTE — Telephone Encounter (Signed)
Pt would like call back on why med was denied please call [phone] Pt says he was just in for visit

## 2018-01-27 ENCOUNTER — Other Ambulatory Visit: Payer: Self-pay

## 2018-01-27 MED ORDER — ALPRAZOLAM 0.5 MG PO TABS: 0.5000 mg | ORAL_TABLET | Freq: Three times a day (TID) | ORAL | 0 refills | Status: DC | PRN

## 2018-03-02 ENCOUNTER — Telehealth: Payer: Self-pay | Admitting: Internal Medicine

## 2018-03-04 NOTE — Telephone Encounter (Signed)
Patient says pharmacy received a denial for alprazolam although it shows as approved yesterday in EPIC. Please resend as patient is going out of town.

## 2018-03-09 NOTE — Telephone Encounter (Signed)
Spoke to pharmacy and they stated that the pt already picked the Rx up. No further action needed.

## 2018-03-22 ENCOUNTER — Ambulatory Visit (INDEPENDENT_AMBULATORY_CARE_PROVIDER_SITE_OTHER): Payer: BLUE CROSS/BLUE SHIELD | Admitting: Internal Medicine

## 2018-03-22 ENCOUNTER — Encounter: Payer: Self-pay | Admitting: Internal Medicine

## 2018-03-22 VITALS — BP 140/68 | HR 88 | Temp 98.1°F | Ht 66.0 in | Wt 177.2 lb

## 2018-03-22 DIAGNOSIS — Z0000 Encounter for general adult medical examination without abnormal findings: Secondary | ICD-10-CM | POA: Diagnosis not present

## 2018-03-22 LAB — CBC WITH DIFFERENTIAL/PLATELET
BASOS PCT: 0.8 % (ref 0.0–3.0)
Basophils Absolute: 0 10*3/uL (ref 0.0–0.1)
EOS PCT: 2.6 % (ref 0.0–5.0)
Eosinophils Absolute: 0.2 10*3/uL (ref 0.0–0.7)
HCT: 48.9 % (ref 39.0–52.0)
HEMOGLOBIN: 16.4 g/dL (ref 13.0–17.0)
LYMPHS ABS: 2.2 10*3/uL (ref 0.7–4.0)
Lymphocytes Relative: 35.7 % (ref 12.0–46.0)
MCHC: 33.5 g/dL (ref 30.0–36.0)
MCV: 93.9 fl (ref 78.0–100.0)
Monocytes Absolute: 0.5 10*3/uL (ref 0.1–1.0)
Monocytes Relative: 8 % (ref 3.0–12.0)
Neutro Abs: 3.2 10*3/uL (ref 1.4–7.7)
Neutrophils Relative %: 52.9 % (ref 43.0–77.0)
Platelets: 245 10*3/uL (ref 150.0–400.0)
RBC: 5.21 Mil/uL (ref 4.22–5.81)
RDW: 13.7 % (ref 11.5–15.5)
WBC: 6 10*3/uL (ref 4.0–10.5)

## 2018-03-22 LAB — COMPREHENSIVE METABOLIC PANEL
ALBUMIN: 4.6 g/dL (ref 3.5–5.2)
ALT: 45 U/L (ref 0–53)
AST: 21 U/L (ref 0–37)
Alkaline Phosphatase: 55 U/L (ref 39–117)
BUN: 25 mg/dL — AB (ref 6–23)
CHLORIDE: 101 meq/L (ref 96–112)
CO2: 28 meq/L (ref 19–32)
CREATININE: 0.72 mg/dL (ref 0.40–1.50)
Calcium: 9.6 mg/dL (ref 8.4–10.5)
GFR: 126.04 mL/min (ref >60.00)
Glucose, Bld: 115 mg/dL — ABNORMAL HIGH (ref 70–99)
POTASSIUM: 4.1 meq/L (ref 3.5–5.1)
SODIUM: 138 meq/L (ref 135–145)
Total Bilirubin: 0.6 mg/dL (ref 0.2–1.2)
Total Protein: 7.5 g/dL (ref 6.0–8.3)

## 2018-03-22 LAB — LIPID PANEL
CHOL/HDL RATIO: 2
CHOLESTEROL: 116 mg/dL (ref 0–200)
HDL: 48.8 mg/dL (ref >39.00)
LDL CALC: 55 mg/dL (ref 0–99)
NonHDL: 66.85
Triglycerides: 58 mg/dL (ref 0.0–149.0)
VLDL: 11.6 mg/dL (ref 0.0–40.0)

## 2018-03-22 LAB — TSH: TSH: 1.04 u[IU]/mL (ref 0.35–4.50)

## 2018-03-22 LAB — HEMOGLOBIN A1C: HEMOGLOBIN A1C: 5.3 % (ref 4.6–6.5)

## 2018-03-22 MED ORDER — AMPHETAMINE-DEXTROAMPHETAMINE 10 MG PO TABS: 10.0000 mg | ORAL_TABLET | Freq: Three times a day (TID) | ORAL | 0 refills | Status: DC | PRN

## 2018-03-22 MED ORDER — ALPRAZOLAM 0.5 MG PO TABS: 0.5000 mg | ORAL_TABLET | Freq: Three times a day (TID) | ORAL | 0 refills | Status: DC | PRN

## 2018-03-22 MED ORDER — HYDROCODONE-ACETAMINOPHEN 5-325 MG PO TABS: ORAL_TABLET | ORAL | 0 refills | Status: DC

## 2018-03-22 NOTE — Progress Notes (Signed)
Subjective:    Patient ID: Shaun Barron, male    DOB: 1976-07-13, 42 y.o.   MRN: [id number]  HPI  42 year-old patient who is seen today for a preventive health examination. He has a history of ADD and has done quite well on present medication.  His son also takes Adderall for ADD No concerns today.  States that he aggravated chronic left shoulder pain with weight training at his health club  He has been on Vicodin 5 mg for number years for occasional shoulder knee and back pain refractory to anti-inflammatory medications.  He has been limited to 30/month He has a history of generalized anxiety disorder and does use alprazolam.  Presently using this only at bedtime as needed  Scheduled for eye examination next week Status post Lasik surgery in the past  Over the last 2 years, he has had some mild impaired glucose tolerance. Does have a history mild allergic rhinitis and anxiety disorder.  Family history.  Father status post CABG and also surgery for bilateral carotid artery stenosis.  History of late onset diabetes.  Mother history of multiple myeloma died of septic complications apparently after attaining remission.  One stepbrother and one stepsister in good health  Past Medical History:  Diagnosis Date  . ALLERGIC RHINITIS 10/09/2008  . ANXIETY 10/09/2008  . GYNECOMASTIA 04/16/2010  . SHOULDER PAIN, LEFT, CHRONIC 10/19/2008     Social History   Socioeconomic History  . Marital status: Married    Spouse name: Not on file  . Number of children: Not on file  . Years of education: Not on file  . Highest education level: Not on file  Occupational History  . Occupation: Cabin crew: [employer]  Social Needs  . Financial resource strain: Not on file  . Food insecurity:    Worry: Not on file    Inability: Not on file  . Transportation needs:    Medical: Not on file    Non-medical: Not on file  Tobacco Use  . Smoking status: Former Smoker    Last attempt  to quit: 09/25/2005    Years since quitting: 12.4  . Smokeless tobacco: Never Used  Substance and Sexual Activity  . Alcohol use: Yes    Comment: rarely  . Drug use: No  . Sexual activity: Not on file  Lifestyle  . Physical activity:    Days per week: Not on file    Minutes per session: Not on file  . Stress: Not on file  Relationships  . Social connections:    Talks on phone: Not on file    Gets together: Not on file    Attends religious service: Not on file    Active member of club or organization: Not on file    Attends meetings of clubs or organizations: Not on file    Relationship status: Not on file  . Intimate partner violence:    Fear of current or ex partner: Not on file    Emotionally abused: Not on file    Physically abused: Not on file    Forced sexual activity: Not on file  Other Topics Concern  . Not on file  Social History Narrative   One step sister and one step brother, both in good health      74 year old son, 57-year old daughter    Past Surgical History:  Procedure Laterality Date  . LASIK      Family History  Problem Relation  Age of Onset  . Coronary artery disease Father        bilat carotid artery stenosis:s/p CABG  . Cancer Mother        hx multiple myeloma-status post stem transpantation July 2008 and presently in remission    No Known Allergies  Current Outpatient Medications on File Prior to Visit  Medication Sig Dispense Refill  . ALPRAZolam (XANAX) 0.5 MG tablet TAKE 1 TABLET BY MOUTH THREE TIMES DAILY AS NEEDED 60 tablet 0  . amphetamine-dextroamphetamine (ADDERALL) 10 MG tablet Take 1 tablet (10 mg total) by mouth 3 (three) times daily as needed. 90 tablet 0  . amphetamine-dextroamphetamine (ADDERALL) 10 MG tablet Take 1 tablet (10 mg total) by mouth 3 (three) times daily as needed. 90 tablet 0  . amphetamine-dextroamphetamine (ADDERALL) 10 MG tablet Take 1 tablet (10 mg total) by mouth 3 (three) times daily as needed. 90 tablet 0  .  HYDROcodone-acetaminophen (NORCO/VICODIN) 5-325 MG tablet TAKE ONE TABLET BY MOUTH EVERY 6 HOURS AS NEEDED FOR PAIN 90 tablet 0   No current facility-administered medications on file prior to visit.     BP 140/68 (BP Location: Right Arm, Patient Position: Sitting, Cuff Size: Large)   Pulse 88   Temp 98.1 F (36.7 C) (Oral)   Ht _0  (1.676 m)   Wt 177 lb 3.2 oz (80.4 kg)   SpO2 98%   BMI 28.60 kg/m    Review of Systems  Constitutional: Negative for appetite change, chills, fatigue and fever.  HENT: Negative for congestion, dental problem, ear pain, hearing loss, sore throat, tinnitus, trouble swallowing and voice change.   Eyes: Negative for pain, discharge and visual disturbance.  Respiratory: Negative for cough, chest tightness, wheezing and stridor.   Cardiovascular: Negative for chest pain, palpitations and leg swelling.  Gastrointestinal: Negative for abdominal distention, abdominal pain, blood in stool, constipation, diarrhea, nausea and vomiting.  Genitourinary: Negative for difficulty urinating, discharge, flank pain, genital sores, hematuria and urgency.  Musculoskeletal: Positive for back pain. Negative for arthralgias, gait problem, joint swelling, myalgias and neck stiffness.       Occasional shoulder and knee pain  Skin: Negative for rash.  Neurological: Negative for dizziness, syncope, speech difficulty, weakness, numbness and headaches.  Hematological: Negative for adenopathy. Does not bruise/bleed easily.  Psychiatric/Behavioral: Negative for behavioral problems and dysphoric mood. The patient is not nervous/anxious.        Objective:   Physical Exam  Constitutional: He is oriented to person, place, and time. He appears well-developed.   Blood pressure 130/80 left arm blood pressure 120/76 right arm  Weight 177  Wt Readings from Last 3 Encounters: 03/22/18 : 177 lb 3.2 oz (80.4 kg) 06/25/17 : 192 lb 6.4 oz (87.3 kg) 03/17/17 : 190 lb 9.6 oz (86.5 kg)    HENT:  Head: Normocephalic.  Right Ear: External ear normal.  Left Ear: External ear normal.  Eyes: Conjunctivae and EOM are normal.  Neck: Normal range of motion.  Cardiovascular: Normal rate and normal heart sounds.  Pulmonary/Chest: Breath sounds normal.  Abdominal: Bowel sounds are normal.  Musculoskeletal: Normal range of motion. He exhibits no edema or tenderness.  Neurological: He is alert and oriented to person, place, and time.  Psychiatric: He has a normal mood and affect. His behavior is normal.          Assessment & Plan:  Preventive health examination Generalized anxiety disorder.  We will continue as needed alprazolam.  Presently uses as needed at bedtime only  ADHD.  Adderall refilled Musculoskeletal pain.  Patient was encouraged to use anti-inflammatory medications and limit hydrocodone  Follow-up 6 months Review updated lab Annual eye examination as scheduled  Marletta Lor

## 2018-03-22 NOTE — Patient Instructions (Signed)
It is important that you exercise regularly, at least 20 minutes 3 to 4 times per week.  If you develop chest pain or shortness of breath seek  medical attention.  Limit your sodium (Salt) intake  Return in 6 months for follow-up  

## 2018-03-22 NOTE — Addendum Note (Signed)
Addended by: Eleonore ChiquitoKWIATKOWSKI, PETER F on: 03/22/2018 10:18 AM   Modules accepted: Orders

## 2018-05-25 ENCOUNTER — Other Ambulatory Visit: Payer: Self-pay

## 2018-05-25 ENCOUNTER — Encounter: Payer: BLUE CROSS/BLUE SHIELD | Admitting: Adult Health

## 2018-05-25 NOTE — Progress Notes (Deleted)
Patient presents to clinic today to establish care. He is a pleasant 42 year old male who  has a past medical history of ALLERGIC RHINITIS (10/09/2008), ANXIETY (10/09/2008), GYNECOMASTIA (04/16/2010), and SHOULDER PAIN, LEFT, CHRONIC (10/19/2008).   Acute Concerns: Establish Care  Chronic Issues:   Health Maintenance: Dental -- Vision -- Immunizations -- Colonoscopy -- Mammogram -- PAP --  Bone Density --    Past Medical History:  Diagnosis Date  . ALLERGIC RHINITIS 10/09/2008  . ANXIETY 10/09/2008  . GYNECOMASTIA 04/16/2010  . SHOULDER PAIN, LEFT, CHRONIC 10/19/2008    Past Surgical History:  Procedure Laterality Date  . LASIK      Current Outpatient Medications on File Prior to Visit  Medication Sig Dispense Refill  . ALPRAZolam (XANAX) 0.5 MG tablet Take 1 tablet (0.5 mg total) by mouth 3 (three) times daily as needed. 60 tablet 0  . amphetamine-dextroamphetamine (ADDERALL) 10 MG tablet Take 1 tablet (10 mg total) by mouth 3 (three) times daily as needed. 90 tablet 0  . amphetamine-dextroamphetamine (ADDERALL) 10 MG tablet Take 1 tablet (10 mg total) by mouth 3 (three) times daily as needed. 90 tablet 0  . amphetamine-dextroamphetamine (ADDERALL) 10 MG tablet Take 1 tablet (10 mg total) by mouth 3 (three) times daily as needed. 90 tablet 0  . HYDROcodone-acetaminophen (NORCO/VICODIN) 5-325 MG tablet TAKE ONE TABLET BY MOUTH EVERY 6 HOURS AS NEEDED FOR PAIN 90 tablet 0   No current facility-administered medications on file prior to visit.     No Known Allergies  Family History  Problem Relation Age of Onset  . Coronary artery disease Father        bilat carotid artery stenosis:s/p CABG  . Cancer Mother        hx multiple myeloma-status post stem transpantation July 2008 and presently in remission    Social History   Socioeconomic History  . Marital status: Married    Spouse name: Not on file  . Number of children: Not on file  . Years of education: Not on  file  . Highest education level: Not on file  Occupational History  . Occupation: Cabin crew: [employer]  Social Needs  . Financial resource strain: Not on file  . Food insecurity:    Worry: Not on file    Inability: Not on file  . Transportation needs:    Medical: Not on file    Non-medical: Not on file  Tobacco Use  . Smoking status: Former Smoker    Last attempt to quit: 09/25/2005    Years since quitting: 12.6  . Smokeless tobacco: Never Used  Substance and Sexual Activity  . Alcohol use: Yes    Comment: rarely  . Drug use: No  . Sexual activity: Not on file  Lifestyle  . Physical activity:    Days per week: Not on file    Minutes per session: Not on file  . Stress: Not on file  Relationships  . Social connections:    Talks on phone: Not on file    Gets together: Not on file    Attends religious service: Not on file    Active member of club or organization: Not on file    Attends meetings of clubs or organizations: Not on file    Relationship status: Not on file  . Intimate partner violence:    Fear of current or ex partner: Not on file    Emotionally abused: Not on file  Physically abused: Not on file    Forced sexual activity: Not on file  Other Topics Concern  . Not on file  Social History Narrative   One step sister and one step brother, both in good health      55 year old son, 9-year old daughter    ROS  There were no vitals taken for this visit.  Physical Exam  Recent Results (from the past 2160 hour(s))  TSH     Status: None   Collection Time: 03/22/18 10:19 AM  Result Value Ref Range   TSH 1.04 0.35 - 4.50 uIU/mL  Lipid panel     Status: None   Collection Time: 03/22/18 10:19 AM  Result Value Ref Range   Cholesterol 116 0 - 200 mg/dL    Comment: ATP III Classification       Desirable:  < 200 mg/dL               Borderline High:  200 - 239 mg/dL          High:  > = 240 mg/dL   Triglycerides 58.0 0.0 - 149.0 mg/dL     Comment: Normal:  <150 mg/dLBorderline High:  150 - 199 mg/dL   HDL 48.80 >39.00 mg/dL   VLDL 11.6 0.0 - 40.0 mg/dL   LDL Cholesterol 55 0 - 99 mg/dL   Total CHOL/HDL Ratio 2     Comment:                Men          Women1/2 Average Risk     3.4          3.3Average Risk          5.0          4.42X Average Risk          9.6          7.13X Average Risk          15.0          11.0                       NonHDL 66.85     Comment: NOTE:  Non-HDL goal should be 30 mg/dL higher than patient's LDL goal (i.e. LDL goal of < 70 mg/dL, would have non-HDL goal of < 100 mg/dL)  Comprehensive metabolic panel     Status: Abnormal   Collection Time: 03/22/18 10:19 AM  Result Value Ref Range   Sodium 138 135 - 145 mEq/L   Potassium 4.1 3.5 - 5.1 mEq/L   Chloride 101 96 - 112 mEq/L   CO2 28 19 - 32 mEq/L   Glucose, Bld 115 (H) 70 - 99 mg/dL   BUN 25 (H) 6 - 23 mg/dL   Creatinine, Ser 0.72 0.40 - 1.50 mg/dL   Total Bilirubin 0.6 0.2 - 1.2 mg/dL   Alkaline Phosphatase 55 39 - 117 U/L   AST 21 0 - 37 U/L   ALT 45 0 - 53 U/L   Total Protein 7.5 6.0 - 8.3 g/dL   Albumin 4.6 3.5 - 5.2 g/dL   Calcium 9.6 8.4 - 10.5 mg/dL   GFR 126.04 >60.00 mL/min  Hemoglobin A1c     Status: None   Collection Time: 03/22/18 10:19 AM  Result Value Ref Range   Hgb A1c MFr Bld 5.3 4.6 - 6.5 %    Comment: Glycemic Control Guidelines for People with Diabetes:Non Diabetic:  <  6%Goal of Therapy: <7%Additional Action Suggested:  >8%   CBC with Differential/Platelet     Status: None   Collection Time: 03/22/18 10:19 AM  Result Value Ref Range   WBC 6.0 4.0 - 10.5 K/uL   RBC 5.21 4.22 - 5.81 Mil/uL   Hemoglobin 16.4 13.0 - 17.0 g/dL   HCT 48.9 39.0 - 52.0 %   MCV 93.9 78.0 - 100.0 fl   MCHC 33.5 30.0 - 36.0 g/dL   RDW 13.7 11.5 - 15.5 %   Platelets 245.0 150.0 - 400.0 K/uL   Neutrophils Relative % 52.9 43.0 - 77.0 %   Lymphocytes Relative 35.7 12.0 - 46.0 %   Monocytes Relative 8.0 3.0 - 12.0 %   Eosinophils Relative 2.6 0.0  - 5.0 %   Basophils Relative 0.8 0.0 - 3.0 %   Neutro Abs 3.2 1.4 - 7.7 K/uL   Lymphs Abs 2.2 0.7 - 4.0 K/uL   Monocytes Absolute 0.5 0.1 - 1.0 K/uL   Eosinophils Absolute 0.2 0.0 - 0.7 K/uL   Basophils Absolute 0.0 0.0 - 0.1 K/uL    Assessment/Plan: No problem-specific Assessment & Plan notes found for this encounter.

## 2018-05-25 NOTE — Telephone Encounter (Signed)
Can you please E-scribe

## 2018-05-25 NOTE — Telephone Encounter (Signed)
ok 

## 2018-05-25 NOTE — Telephone Encounter (Signed)
Pt is scheduled to establish care with Ardyth Harps but needs refill on Xanax.

## 2018-05-25 NOTE — Telephone Encounter (Signed)
Okay for refill? Please advise 

## 2018-05-26 MED ORDER — ALPRAZOLAM 0.5 MG PO TABS: 0.5000 mg | ORAL_TABLET | Freq: Three times a day (TID) | ORAL | 0 refills | Status: DC | PRN

## 2018-07-01 ENCOUNTER — Emergency Department (HOSPITAL_BASED_OUTPATIENT_CLINIC_OR_DEPARTMENT_OTHER)
Admission: EM | Admit: 2018-07-01 | Discharge: 2018-07-01 | Disposition: A | Discharge status: Home / Self Care | Payer: BLUE CROSS/BLUE SHIELD | Attending: Emergency Medicine | Admitting: Emergency Medicine

## 2018-07-01 ENCOUNTER — Other Ambulatory Visit: Payer: Self-pay

## 2018-07-01 ENCOUNTER — Encounter (HOSPITAL_BASED_OUTPATIENT_CLINIC_OR_DEPARTMENT_OTHER): Payer: Self-pay

## 2018-07-01 DIAGNOSIS — R51 Headache: Secondary | ICD-10-CM | POA: Diagnosis present

## 2018-07-01 DIAGNOSIS — G43009 Migraine without aura, not intractable, without status migrainosus: Secondary | ICD-10-CM | POA: Insufficient documentation

## 2018-07-01 DIAGNOSIS — I10 Essential (primary) hypertension: Secondary | ICD-10-CM | POA: Insufficient documentation

## 2018-07-01 DIAGNOSIS — Z87891 Personal history of nicotine dependence: Secondary | ICD-10-CM | POA: Insufficient documentation

## 2018-07-01 LAB — CBC WITH DIFFERENTIAL/PLATELET
Abs Immature Granulocytes: 0.09 10*3/uL — ABNORMAL HIGH (ref 0.00–0.07)
Basophils Absolute: 0.1 10*3/uL (ref 0.0–0.1)
Basophils Relative: 1 %
EOS ABS: 0.2 10*3/uL (ref 0.0–0.5)
EOS PCT: 2 %
HEMATOCRIT: 42.9 % (ref 39.0–52.0)
Hemoglobin: 14.1 g/dL (ref 13.0–17.0)
IMMATURE GRANULOCYTES: 1 %
LYMPHS ABS: 1.2 10*3/uL (ref 0.7–4.0)
Lymphocytes Relative: 10 %
MCH: 30.5 pg (ref 26.0–34.0)
MCHC: 32.9 g/dL (ref 30.0–36.0)
MCV: 92.7 fL (ref 80.0–100.0)
MONO ABS: 0.8 10*3/uL (ref 0.1–1.0)
MONOS PCT: 7 %
NEUTROS PCT: 79 %
Neutro Abs: 10.2 10*3/uL — ABNORMAL HIGH (ref 1.7–7.7)
PLATELETS: 394 10*3/uL (ref 150–400)
RBC: 4.63 MIL/uL (ref 4.22–5.81)
RDW: 13.5 % (ref 11.5–15.5)
WBC: 12.6 10*3/uL — ABNORMAL HIGH (ref 4.0–10.5)
nRBC: 0 % (ref 0.0–0.2)

## 2018-07-01 LAB — URINALYSIS, ROUTINE W REFLEX MICROSCOPIC
BILIRUBIN URINE: NEGATIVE
Glucose, UA: NEGATIVE mg/dL
Ketones, ur: NEGATIVE mg/dL
Leukocytes, UA: NEGATIVE
NITRITE: NEGATIVE
PROTEIN: NEGATIVE mg/dL
SPECIFIC GRAVITY, URINE: 1.01 (ref 1.005–1.030)
pH: 6.5 (ref 5.0–8.0)

## 2018-07-01 LAB — BASIC METABOLIC PANEL
Anion gap: 12 (ref 5–15)
BUN: 24 mg/dL — ABNORMAL HIGH (ref 6–20)
CALCIUM: 9.2 mg/dL (ref 8.9–10.3)
CO2: 23 mmol/L (ref 22–32)
CREATININE: 0.8 mg/dL (ref 0.61–1.24)
Chloride: 98 mmol/L (ref 98–111)
GFR calc Af Amer: >60 mL/min (ref >60)
GFR calc non Af Amer: >60 mL/min (ref >60)
GLUCOSE: 116 mg/dL — AB (ref 70–99)
Potassium: 4.1 mmol/L (ref 3.5–5.1)
Sodium: 133 mmol/L — ABNORMAL LOW (ref 135–145)

## 2018-07-01 LAB — URINALYSIS, MICROSCOPIC (REFLEX)

## 2018-07-01 MED ORDER — DEXAMETHASONE SODIUM PHOSPHATE 10 MG/ML IJ SOLN
10.0000 mg | Freq: Once | INTRAMUSCULAR | Status: AC
  Administered 2018-07-01: 10 mg via INTRAVENOUS
  Filled 2018-07-01: qty 1

## 2018-07-01 MED ORDER — KETOROLAC TROMETHAMINE 30 MG/ML IJ SOLN
30.0000 mg | Freq: Once | INTRAMUSCULAR | Status: AC
  Administered 2018-07-01: 30 mg via INTRAVENOUS
  Filled 2018-07-01: qty 1

## 2018-07-01 MED ORDER — METOCLOPRAMIDE HCL 5 MG/ML IJ SOLN
10.0000 mg | Freq: Once | INTRAMUSCULAR | Status: AC
  Administered 2018-07-01: 10 mg via INTRAVENOUS
  Filled 2018-07-01: qty 2

## 2018-07-01 NOTE — ED Notes (Signed)
Pt/family verbalized understanding of discharge instructions.   

## 2018-07-01 NOTE — Discharge Instructions (Signed)
You have been seen in the Emergency Department (ED) for a headache.  Please use Tylenol or Motrin as needed for symptoms, but only as written on the box.  As we have discussed, please follow up with your primary care doctor as soon as possible regarding todays Emergency Department (ED) visit and your headache symptoms.    Your blood pressure was elevated. Call your PCP today to schedule an outpatient appointment.   Call your doctor or return to the ED if you have a worsening headache, sudden and severe headache, confusion, slurred speech, facial droop, weakness or numbness in any arm or leg, extreme fatigue, vision problems, or other symptoms that concern you.

## 2018-07-01 NOTE — ED Provider Notes (Signed)
Emergency Department Provider Note   I have reviewed the triage vital signs and the nursing notes.   HISTORY  Chief Complaint Headache   HPI Shaun Barron is a 42 y.o. male past medical history of ADHD and migraine HA resents to the emergency department for evaluation of migraine type headache for the past 3 days.  He states initially he had pain on the left side which then changed to the right and then back to the left.  He states it feels typical of his prior migraine headaches but he has not had one in the last 4 years.  He has some light sensitivity.  He denies fevers or chills.  No numbness or weakness.  Denies any neck or back pain.  No head injury.  No sudden onset, maximal intensity headache symptoms.  Past Medical History:  Diagnosis Date  . ALLERGIC RHINITIS 10/09/2008  . ANXIETY 10/09/2008  . GYNECOMASTIA 04/16/2010  . SHOULDER PAIN, LEFT, CHRONIC 10/19/2008    Patient Active Problem List   Diagnosis Date Noted  . ADHD (attention deficit hyperactivity disorder) 03/23/2015  . Acute prostatitis with hematuria 06/22/2013  . GYNECOMASTIA 04/16/2010  . SHOULDER PAIN, LEFT, CHRONIC 10/19/2008  . ANXIETY 10/09/2008  . Allergic rhinitis 10/09/2008    Past Surgical History:  Procedure Laterality Date  . LASIK     Allergies Patient has no known allergies.  Family History  Problem Relation Age of Onset  . Coronary artery disease Father        bilat carotid artery stenosis:s/p CABG  . Cancer Mother        hx multiple myeloma-status post stem transpantation July 2008 and presently in remission    Social History Social History   Tobacco Use  . Smoking status: Former Smoker    Last attempt to quit: 09/25/2005    Years since quitting: 12.7  . Smokeless tobacco: Never Used  Substance Use Topics  . Alcohol use: Yes    Comment: rarely  . Drug use: No    Review of Systems  Constitutional: No fever/chills Eyes: No visual changes. ENT: No sore  throat. Cardiovascular: Denies chest pain. Respiratory: Denies shortness of breath. Gastrointestinal: No abdominal pain.  No nausea, no vomiting.  No diarrhea.  No constipation. Genitourinary: Negative for dysuria. Musculoskeletal: Negative for back pain. Skin: Negative for rash. Neurological: Negative for focal weakness or numbness. Positive HA.   10-point ROS otherwise negative.  ____________________________________________   PHYSICAL EXAM:  VITAL SIGNS: ED Triage Vitals  Enc Vitals Group     BP 07/01/18 1227 (!) 195/92     Pulse Rate 07/01/18 1227 (!) 108     Resp 07/01/18 1227 18     Temp 07/01/18 1227 99.2 F (37.3 C)     Temp Source 07/01/18 1227 Oral     SpO2 07/01/18 1227 95 %     Weight 07/01/18 1226 184 lb (83.5 kg)     Height 07/01/18 1226 _0  (1.676 m)     Pain Score 07/01/18 1225 4   Constitutional: Alert and oriented. Well appearing and in no acute distress. Eyes: Conjunctivae are normal.  Head: Atraumatic. Nose: No congestion/rhinnorhea. Mouth/Throat: Mucous membranes are moist.  Oropharynx non-erythematous. Neck: No stridor.   Cardiovascular: Normal rate, regular rhythm. Good peripheral circulation. 4/6 systolic heart murmur.  Respiratory: Normal respiratory effort.  No retractions. Lungs CTAB. Gastrointestinal: Soft and nontender. No distention.  Musculoskeletal: No lower extremity tenderness nor edema. No gross deformities of extremities. Neurologic:  Normal speech  and language. No gross focal neurologic deficits are appreciated. Normal CN exam 2-12.  Skin:  Skin is warm, dry and intact. No rash noted.  ____________________________________________   LABS (all labs ordered are listed, but only abnormal results are displayed)  Labs Reviewed  BASIC METABOLIC PANEL - Abnormal; Notable for the following components:      Result Value   Sodium 133 (*)    Glucose, Bld 116 (*)    BUN 24 (*)    All other components within normal limits  CBC WITH  DIFFERENTIAL/PLATELET - Abnormal; Notable for the following components:   WBC 12.6 (*)    Neutro Abs 10.2 (*)    Abs Immature Granulocytes 0.09 (*)    All other components within normal limits  URINALYSIS, ROUTINE W REFLEX MICROSCOPIC - Abnormal; Notable for the following components:   Hgb urine dipstick TRACE (*)    All other components within normal limits  URINALYSIS, MICROSCOPIC (REFLEX) - Abnormal; Notable for the following components:   Bacteria, UA RARE (*)    All other components within normal limits   ____________________________________________  RADIOLOGY  None ____________________________________________   PROCEDURES  Procedure(s) performed:   Procedures  None ____________________________________________   INITIAL IMPRESSION / ASSESSMENT AND PLAN / ED COURSE  Pertinent labs & imaging results that were available during my care of the patient were reviewed by me and considered in my medical decision making (see chart for details).  Patient presents to the emergency department for evaluation of 3 days of headache.  The headache quality, location, character is similar to his prior migraine headaches but he has not had one in the last 4 years.  I have no concern for infectious etiology of headache.  No concern clinically for subarachnoid hemorrhage although I did sitter this diagnosis.  Patient does have significant elevated blood pressure here but no global encephalopathy signs or focal neurological deficits to suspect hypertensive emergency.  He does have a heart murmur on exam which he has not been told about in the past.  Not having any chest pain, palpitations, shortness of breath symptoms.  He does not appear volume overloaded.  I do not feel he needs further work-up for this but have discussed this with him and that he will need outpatient PCP follow-up and possibly ECHO.   2:26 PM Patient with rapidly improving HA. Labs reviewed with no sign of end-organ damage  from HTN. Patient has a PCP appointment on Nov 20th to discuss HTN. No meds at this time. Discussed ED return precautions.   At this time, I do not feel there is any life-threatening condition present. I have reviewed and discussed all results (EKG, imaging, lab, urine as appropriate), exam findings with patient. I have reviewed nursing notes and appropriate previous records.  I feel the patient is safe to be discharged home without further emergent workup. Discussed usual and customary return precautions. Patient and family (if present) verbalize understanding and are comfortable with this plan.  Patient will follow-up with their primary care provider. If they do not have a primary care provider, information for follow-up has been provided to them. All questions have been answered.  ____________________________________________  FINAL CLINICAL IMPRESSION(S) / ED DIAGNOSES  Final diagnoses:  Migraine without aura and without status migrainosus, not intractable  Essential hypertension     MEDICATIONS GIVEN DURING THIS VISIT:  Medications  ketorolac (TORADOL) 30 MG/ML injection 30 mg (30 mg Intravenous Given 07/01/18 1255)  metoCLOPramide (REGLAN) injection 10 mg (10 mg Intravenous Given  07/01/18 1259)  dexamethasone (DECADRON) injection 10 mg (10 mg Intravenous Given 07/01/18 1256)    Note:  This document was prepared using Dragon voice recognition software and may include unintentional dictation errors.  Nanda Quinton, MD Emergency Medicine    Flor Whitacre, [facility], MD 07/01/18 [phone]

## 2018-07-01 NOTE — ED Triage Notes (Signed)
C/o HA x 3 days-NAD-steady gait 

## 2018-07-13 ENCOUNTER — Encounter: Payer: BLUE CROSS/BLUE SHIELD | Admitting: Internal Medicine

## 2018-07-13 DIAGNOSIS — R7302 Impaired glucose tolerance (oral): Secondary | ICD-10-CM | POA: Insufficient documentation

## 2018-07-13 NOTE — Progress Notes (Signed)
Established Patient Office Visit     CC/Reason for Visit: To establish care with me as a new provider, medication refill and follow-up of chronic medical conditions  HPI: Shaun Barron is a 42 y.o. male who is coming in today for the above mentioned reasons. Had annual physical in July 2019. Seen in the ED on 11/7 for a migraine HA and noted to have elevated BP. No h/o HTN. HA rapidly resolved, no signs of end-organ damage and he was discharged home. Past Medical History is significant for: ADHD, anxiety, IGT (A1C in 7/19 was 5.3).  He states that he sometimes has a headache and he knows this is when his blood pressure is high.  He has not done ambulatory blood pressure measurements but admits to having whitecoat hypertension.  Other than this has no acute complaints today.  He was diagnosed with ADHD by Dr. Raliegh Ip and has been managed by him, has not seen a behavioral health provider.  He also has a diagnosis of anxiety and has been getting Xanax through our office as well as hydrocodone for unspecified joint pain.   Past Medical/Surgical History: Past Medical History:  Diagnosis Date  . ALLERGIC RHINITIS 10/09/2008  . ANXIETY 10/09/2008  . GYNECOMASTIA 04/16/2010  . SHOULDER PAIN, LEFT, CHRONIC 10/19/2008    Past Surgical History:  Procedure Laterality Date  . LASIK      Social History:  reports that he quit smoking about 12 years ago. He has never used smokeless tobacco. He reports that he drinks alcohol. He reports that he does not use drugs.  Allergies: No Known Allergies  Family History:  Family History  Problem Relation Age of Onset  . Coronary artery disease Father        bilat carotid artery stenosis:s/p CABG  . Cancer Mother        hx multiple myeloma-status post stem transpantation July 2008 and presently in remission     Current Outpatient Medications:  .  ALPRAZolam (XANAX) 0.5 MG tablet, Take 1 tablet (0.5 mg total) by mouth 3 (three) times daily as needed.,  Disp: 60 tablet, Rfl: 0 .  amphetamine-dextroamphetamine (ADDERALL) 10 MG tablet, Take 1 tablet (10 mg total) by mouth 3 (three) times daily as needed., Disp: 90 tablet, Rfl: 0 .  amphetamine-dextroamphetamine (ADDERALL) 10 MG tablet, Take 1 tablet (10 mg total) by mouth 3 (three) times daily as needed., Disp: 90 tablet, Rfl: 0 .  amphetamine-dextroamphetamine (ADDERALL) 10 MG tablet, Take 1 tablet (10 mg total) by mouth 3 (three) times daily as needed., Disp: 90 tablet, Rfl: 0 .  HYDROcodone-acetaminophen (NORCO/VICODIN) 5-325 MG tablet, TAKE ONE TABLET BY MOUTH EVERY 6 HOURS AS NEEDED FOR PAIN, Disp: 90 tablet, Rfl: 0  Review of Systems:  Constitutional: Denies fever, chills, diaphoresis, appetite change and fatigue.  HEENT: Denies photophobia, eye pain, redness, hearing loss, ear pain, congestion, sore throat, rhinorrhea, sneezing, mouth sores, trouble swallowing, neck pain, neck stiffness and tinnitus.   Respiratory: Denies SOB, DOE, cough, chest tightness,  and wheezing.   Cardiovascular: Denies chest pain, palpitations and leg swelling.  Gastrointestinal: Denies nausea, vomiting, abdominal pain, diarrhea, constipation, blood in stool and abdominal distention.  Genitourinary: Denies dysuria, urgency, frequency, hematuria, flank pain and difficulty urinating.  Endocrine: Denies: hot or cold intolerance, sweats, changes in hair or nails, polyuria, polydipsia. Musculoskeletal: Denies myalgias, back pain, joint swelling, arthralgias and gait problem.  Skin: Denies pallor, rash and wound.  Neurological: Denies dizziness, seizures, syncope, weakness, light-headedness, numbness  and headaches.  Hematological: Denies adenopathy. Easy bruising, personal or family bleeding history  Psychiatric/Behavioral: Denies suicidal ideation, mood changes, confusion, nervousness, sleep disturbance and agitation    Physical Exam: Vitals:   07/14/18 0821  BP: (!) 160/90  Pulse: 86  Temp: 98.5 F (36.9 C)    TempSrc: Oral  SpO2: 96%  Weight: 174 lb 12.8 oz (79.3 kg)    Body mass index is 28.21 kg/m.    Constitutional: NAD, calm, comfortable Eyes: PERRL, lids and conjunctivae normal ENMT: Mucous membranes are moist. Posterior pharynx clear of any exudate or lesions. Normal dentition. Tympanic membrane is pearly white, no erythema or bulging. Neck: normal, supple, no masses, no thyromegaly Respiratory: clear to auscultation bilaterally, no wheezing, no crackles. Normal respiratory effort. No accessory muscle use.  Cardiovascular: Regular rate and rhythm, no murmurs / rubs / gallops. No extremity edema. 2+ pedal pulses. No carotid bruits.  Abdomen: no tenderness, no masses palpated. No hepatosplenomegaly. Bowel sounds positive.  Musculoskeletal: no clubbing / cyanosis. No joint deformity upper and lower extremities. Good ROM, no contractures. Normal muscle tone.  Skin: no rashes, lesions, ulcers. No induration Neurologic: CN 2-12 grossly intact. Sensation intact, DTR normal. Strength 5/5 in all 4.  Psychiatric: Normal judgment and insight. Alert and oriented x 3. Normal mood.    Impression and Plan:  Attention deficit hyperactivity disorder (ADHD), predominantly inattentive type -Continue adderall.  Has never seen psychiatry before, this has been managed by Dr. Raliegh Ip in the past. -Needs psych referral as I do not feel comfortable continuing to prescribe this medication. -Patient has been given brochure on behavioral health providers in the area and has been instructed to call with appointment as no further refills will be provided until he can document that he has made this follow-up.  Anxiety state -On xanax. -With psychiatric diagnosis, if BDZ are needed, they will need to be prescribed by psych. -I am willing to cover his prescription until he can get in to see psychiatry at which point it will be psychiatry's decision if they want to continue this or not. -If psychiatry is not willing to  prescribe benzodiazepines, I will initiate a taper.  Elevated BP without diagnosis of hypertension -Uncontrolled today, measurements today are 160/90 and 180/95. -BP 195/92 at recent ED visit, but normal during physical here in July. -He states he gets really nervous when he sees doctors and he thinks that is why his blood pressure is high. -Have advised ambulatory blood pressure monitoring and follow-up in 4 weeks.  At that time if blood pressure remains elevated will initiate treatment for hypertension.  IGT (impaired glucose tolerance) -A1C 5.3. 6.0 before. -Due for A1C check today -Discussed healthy lifestyle and diet recommendations.  Diffuse joint pains -Of left shoulder, bilateral knees, back. -States he saw an orthopedist out of [facility] in February for his knee issues and was told he had osteoarthritis, was not prescribed any medications and was told he did not need surgery. -He has been prescribed hydrocodone 30 pills a month by Dr. Raliegh Ip for this for some time. -Have discussed with patient alternative pain relief strategies including but not limited to ice, physical therapy, preferential use of ibuprofen which is also an anti-inflammatory. -We will decrease hydrocodone prescription to 15 pills a month.  Have told patient that if he continues to require this he will need follow-up with orthopedics to determine if further opioid prescription is needed as I am not willing to continue to prescribe this long-term.   Patient Instructions  BEFORE YOU LEAVE: -Labs  We have ordered labs or studies at this visit. It can take up to 1-2 weeks for results and processing. IF results require follow up or explanation, we will call you with instructions. Clinically stable results will be released to your [facility]. If you have not heard from [place] or cannot find your results in [facility] in 2 weeks please contact our office at [phone].  If you are not yet signed up for [facility], please consider signing  up.  -We have discussed alternative pain relief strategies including but not limited to ice, physical therapy, preferential use of ibuprofen which is also an anti-inflammatory and to reserve hydrocodone for now for pain uncontrolled with above measures.   -Follow up: 4 weeks for blood pressure check       DASH Eating Plan DASH stands for "Dietary Approaches to Stop Hypertension." The DASH eating plan is a healthy eating plan that has been shown to reduce high blood pressure (hypertension). It may also reduce your risk for type 2 diabetes, heart disease, and stroke. The DASH eating plan may also help with weight loss. What are tips for following this plan? General guidelines  Avoid eating more than 2,300 mg (milligrams) of salt (sodium) a day. If you have hypertension, you may need to reduce your sodium intake to 1,500 mg a day.  Limit alcohol intake to no more than 1 drink a day for nonpregnant women and 2 drinks a day for men. One drink equals 12 oz of beer, 5 oz of wine, or 1 oz of hard liquor.  Work with your health care provider to maintain a healthy body weight or to lose weight. Ask what an ideal weight is for you.  Get at least 30 minutes of exercise that causes your heart to beat faster (aerobic exercise) most days of the week. Activities may include walking, swimming, or biking.  Work with your health care provider or diet and nutrition specialist (dietitian) to adjust your eating plan to your individual calorie needs. Reading food labels  Check food labels for the amount of sodium per serving. Choose foods with less than 5 percent of the Daily Value of sodium. Generally, foods with less than 300 mg of sodium per serving fit into this eating plan.  To find whole grains, look for the word "whole" as the first word in the ingredient list. Shopping  Buy products labeled as "low-sodium" or "no salt added."  Buy fresh foods. Avoid canned foods and premade or frozen  meals. Cooking  Avoid adding salt when cooking. Use salt-free seasonings or herbs instead of table salt or sea salt. Check with your health care provider or pharmacist before using salt substitutes.  Do not fry foods. Cook foods using healthy methods such as baking, boiling, grilling, and broiling instead.  Cook with heart-healthy oils, such as olive, canola, soybean, or sunflower oil. Meal planning   Eat a balanced diet that includes: ? 5 or more servings of fruits and vegetables each day. At each meal, try to fill half of your plate with fruits and vegetables. ? Up to 6-8 servings of whole grains each day. ? Less than 6 oz of lean meat, poultry, or fish each day. A 3-oz serving of meat is about the same size as a deck of cards. One egg equals 1 oz. ? 2 servings of low-fat dairy each day. ? A serving of nuts, seeds, or beans 5 times each week. ? Heart-healthy fats. Healthy fats called Omega-3 fatty  acids are found in foods such as flaxseeds and coldwater fish, like sardines, salmon, and mackerel.  Limit how much you eat of the following: ? Canned or prepackaged foods. ? Food that is high in trans fat, such as fried foods. ? Food that is high in saturated fat, such as fatty meat. ? Sweets, desserts, sugary drinks, and other foods with added sugar. ? Full-fat dairy products.  Do not salt foods before eating.  Try to eat at least 2 vegetarian meals each week.  Eat more home-cooked food and less restaurant, buffet, and fast food.  When eating at a restaurant, ask that your food be prepared with less salt or no salt, if possible. What foods are recommended? The items listed may not be a complete list. Talk with your dietitian about what dietary choices are best for you. Grains Whole-grain or whole-wheat bread. Whole-grain or whole-wheat pasta. Brown rice. [place]. Bulgur. Whole-grain and low-sodium cereals. Pita bread. Low-fat, low-sodium crackers. Whole-wheat flour  tortillas. Vegetables Fresh or frozen vegetables (raw, steamed, roasted, or grilled). Low-sodium or reduced-sodium tomato and vegetable juice. Low-sodium or reduced-sodium tomato sauce and tomato paste. Low-sodium or reduced-sodium canned vegetables. Fruits All fresh, dried, or frozen fruit. Canned fruit in natural juice (without added sugar). Meat and other protein foods Skinless chicken or [place]. Ground chicken or [place]. Pork with fat trimmed off. Fish and seafood. Egg whites. Dried beans, peas, or lentils. Unsalted nuts, nut butters, and seeds. Unsalted canned beans. Lean cuts of beef with fat trimmed off. Low-sodium, lean deli meat. Dairy Low-fat (1%) or fat-free (skim) milk. Fat-free, low-fat, or reduced-fat cheeses. Nonfat, low-sodium ricotta or cottage cheese. Low-fat or nonfat yogurt. Low-fat, low-sodium cheese. Fats and oils Soft margarine without trans fats. Vegetable oil. Low-fat, reduced-fat, or light mayonnaise and salad dressings (reduced-sodium). Canola, safflower, olive, soybean, and sunflower oils. Avocado. Seasoning and other foods Herbs. Spices. Seasoning mixes without salt. Unsalted popcorn and pretzels. Fat-free sweets. What foods are not recommended? The items listed may not be a complete list. Talk with your dietitian about what dietary choices are best for you. Grains Baked goods made with fat, such as croissants, muffins, or some breads. Dry pasta or rice meal packs. Vegetables Creamed or fried vegetables. Vegetables in a cheese sauce. Regular canned vegetables (not low-sodium or reduced-sodium). Regular canned tomato sauce and paste (not low-sodium or reduced-sodium). Regular tomato and vegetable juice (not low-sodium or reduced-sodium). Angie Fava. Olives. Fruits Canned fruit in a light or heavy syrup. Fried fruit. Fruit in cream or butter sauce. Meat and other protein foods Fatty cuts of meat. Ribs. Fried meat. Berniece Salines. Sausage. Bologna and other processed lunch meats.  Salami. Fatback. Hotdogs. Bratwurst. Salted nuts and seeds. Canned beans with added salt. Canned or smoked fish. Whole eggs or egg yolks. Chicken or [place] with skin. Dairy Whole or 2% milk, cream, and half-and-half. Whole or full-fat cream cheese. Whole-fat or sweetened yogurt. Full-fat cheese. Nondairy creamers. Whipped toppings. Processed cheese and cheese spreads. Fats and oils Butter. Stick margarine. Lard. Shortening. Ghee. Bacon fat. Tropical oils, such as coconut, palm kernel, or palm oil. Seasoning and other foods Salted popcorn and pretzels. Onion salt, garlic salt, seasoned salt, table salt, and sea salt. Worcestershire sauce. Tartar sauce. Barbecue sauce. Teriyaki sauce. Soy sauce, including reduced-sodium. Steak sauce. Canned and packaged gravies. Fish sauce. Oyster sauce. Cocktail sauce. Horseradish that you find on the shelf. Ketchup. Mustard. Meat flavorings and tenderizers. Bouillon cubes. Hot sauce and Tabasco sauce. Premade or packaged marinades. Premade or packaged  taco seasonings. Relishes. Regular salad dressings. Where to find more information:  National Heart, Lung, and [place]: https://wilson-eaton.com/  American Heart Association: www.heart.org Summary  The DASH eating plan is a healthy eating plan that has been shown to reduce high blood pressure (hypertension). It may also reduce your risk for type 2 diabetes, heart disease, and stroke.  With the DASH eating plan, you should limit salt (sodium) intake to 2,300 mg a day. If you have hypertension, you may need to reduce your sodium intake to 1,500 mg a day.  When on the DASH eating plan, aim to eat more fresh fruits and vegetables, whole grains, lean proteins, low-fat dairy, and heart-healthy fats.  Work with your health care provider or diet and nutrition specialist (dietitian) to adjust your eating plan to your individual calorie needs. This information is not intended to replace advice given to you by your health  care provider. Make sure you discuss any questions you have with your health care provider. Document Released: 07/31/2011 Document Revised: 08/04/2016 Document Reviewed: 08/04/2016 Elsevier Interactive Patient Education  2018 [place], [place] Jacklynn Ganong

## 2018-07-14 ENCOUNTER — Encounter: Payer: Self-pay | Admitting: Internal Medicine

## 2018-07-14 ENCOUNTER — Ambulatory Visit (INDEPENDENT_AMBULATORY_CARE_PROVIDER_SITE_OTHER): Payer: BLUE CROSS/BLUE SHIELD | Admitting: Internal Medicine

## 2018-07-14 VITALS — BP 160/90 | HR 86 | Temp 98.5°F | Wt 174.8 lb

## 2018-07-14 DIAGNOSIS — R030 Elevated blood-pressure reading, without diagnosis of hypertension: Secondary | ICD-10-CM

## 2018-07-14 DIAGNOSIS — F411 Generalized anxiety disorder: Secondary | ICD-10-CM | POA: Diagnosis not present

## 2018-07-14 DIAGNOSIS — F900 Attention-deficit hyperactivity disorder, predominantly inattentive type: Secondary | ICD-10-CM | POA: Diagnosis not present

## 2018-07-14 DIAGNOSIS — R7302 Impaired glucose tolerance (oral): Secondary | ICD-10-CM

## 2018-07-14 LAB — HEMOGLOBIN A1C: HEMOGLOBIN A1C: 5 % (ref 4.6–6.5)

## 2018-07-14 MED ORDER — AMPHETAMINE-DEXTROAMPHETAMINE 10 MG PO TABS: 10.0000 mg | ORAL_TABLET | Freq: Three times a day (TID) | ORAL | 0 refills | Status: DC | PRN

## 2018-07-14 MED ORDER — ALPRAZOLAM 0.5 MG PO TABS: 0.5000 mg | ORAL_TABLET | Freq: Every day | ORAL | 0 refills | Status: DC

## 2018-07-14 MED ORDER — HYDROCODONE-ACETAMINOPHEN 5-325 MG PO TABS: ORAL_TABLET | ORAL | 0 refills | Status: DC

## 2018-07-14 NOTE — Addendum Note (Signed)
Addended by: Bonnye FavaKWEI, Zareah Hunzeker K on: 07/14/2018 09:09 AM   Modules accepted: Orders

## 2018-07-14 NOTE — Patient Instructions (Addendum)
BEFORE YOU LEAVE: -Labs  We have ordered labs or studies at this visit. It can take up to 1-2 weeks for results and processing. IF results require follow up or explanation, we will call you with instructions. Clinically stable results will be released to your [facility]. If you have not heard from [place] or cannot find your results in [facility] in 2 weeks please contact our office at [phone].  If you are not yet signed up for [facility], please consider signing up.  -We have discussed alternative pain relief strategies including but not limited to ice, physical therapy, preferential use of ibuprofen which is also an anti-inflammatory and to reserve hydrocodone for now for pain uncontrolled with above measures.  -We have provided you with a brochure with behavioral health providers in the area.  Please make sure you schedule follow-up with them.   -Follow up: 4 weeks for blood pressure check       DASH Eating Plan DASH stands for "Dietary Approaches to Stop Hypertension." The DASH eating plan is a healthy eating plan that has been shown to reduce high blood pressure (hypertension). It may also reduce your risk for type 2 diabetes, heart disease, and stroke. The DASH eating plan may also help with weight loss. What are tips for following this plan? General guidelines  Avoid eating more than 2,300 mg (milligrams) of salt (sodium) a day. If you have hypertension, you may need to reduce your sodium intake to 1,500 mg a day.  Limit alcohol intake to no more than 1 drink a day for nonpregnant women and 2 drinks a day for men. One drink equals 12 oz of beer, 5 oz of wine, or 1 oz of hard liquor.  Work with your health care provider to maintain a healthy body weight or to lose weight. Ask what an ideal weight is for you.  Get at least 30 minutes of exercise that causes your heart to beat faster (aerobic exercise) most days of the week. Activities may include walking, swimming, or biking.  Work with  your health care provider or diet and nutrition specialist (dietitian) to adjust your eating plan to your individual calorie needs. Reading food labels  Check food labels for the amount of sodium per serving. Choose foods with less than 5 percent of the Daily Value of sodium. Generally, foods with less than 300 mg of sodium per serving fit into this eating plan.  To find whole grains, look for the word "whole" as the first word in the ingredient list. Shopping  Buy products labeled as "low-sodium" or "no salt added."  Buy fresh foods. Avoid canned foods and premade or frozen meals. Cooking  Avoid adding salt when cooking. Use salt-free seasonings or herbs instead of table salt or sea salt. Check with your health care provider or pharmacist before using salt substitutes.  Do not fry foods. Cook foods using healthy methods such as baking, boiling, grilling, and broiling instead.  Cook with heart-healthy oils, such as olive, canola, soybean, or sunflower oil. Meal planning   Eat a balanced diet that includes: ? 5 or more servings of fruits and vegetables each day. At each meal, try to fill half of your plate with fruits and vegetables. ? Up to 6-8 servings of whole grains each day. ? Less than 6 oz of lean meat, poultry, or fish each day. A 3-oz serving of meat is about the same size as a deck of cards. One egg equals 1 oz. ? 2 servings of low-fat  dairy each day. ? A serving of nuts, seeds, or beans 5 times each week. ? Heart-healthy fats. Healthy fats called Omega-3 fatty acids are found in foods such as flaxseeds and coldwater fish, like sardines, salmon, and mackerel.  Limit how much you eat of the following: ? Canned or prepackaged foods. ? Food that is high in trans fat, such as fried foods. ? Food that is high in saturated fat, such as fatty meat. ? Sweets, desserts, sugary drinks, and other foods with added sugar. ? Full-fat dairy products.  Do not salt foods before  eating.  Try to eat at least 2 vegetarian meals each week.  Eat more home-cooked food and less restaurant, buffet, and fast food.  When eating at a restaurant, ask that your food be prepared with less salt or no salt, if possible. What foods are recommended? The items listed may not be a complete list. Talk with your dietitian about what dietary choices are best for you. Grains Whole-grain or whole-wheat bread. Whole-grain or whole-wheat pasta. Brown rice. [place]. Bulgur. Whole-grain and low-sodium cereals. Pita bread. Low-fat, low-sodium crackers. Whole-wheat flour tortillas. Vegetables Fresh or frozen vegetables (raw, steamed, roasted, or grilled). Low-sodium or reduced-sodium tomato and vegetable juice. Low-sodium or reduced-sodium tomato sauce and tomato paste. Low-sodium or reduced-sodium canned vegetables. Fruits All fresh, dried, or frozen fruit. Canned fruit in natural juice (without added sugar). Meat and other protein foods Skinless chicken or [place]. Ground chicken or [place]. Pork with fat trimmed off. Fish and seafood. Egg whites. Dried beans, peas, or lentils. Unsalted nuts, nut butters, and seeds. Unsalted canned beans. Lean cuts of beef with fat trimmed off. Low-sodium, lean deli meat. Dairy Low-fat (1%) or fat-free (skim) milk. Fat-free, low-fat, or reduced-fat cheeses. Nonfat, low-sodium ricotta or cottage cheese. Low-fat or nonfat yogurt. Low-fat, low-sodium cheese. Fats and oils Soft margarine without trans fats. Vegetable oil. Low-fat, reduced-fat, or light mayonnaise and salad dressings (reduced-sodium). Canola, safflower, olive, soybean, and sunflower oils. Avocado. Seasoning and other foods Herbs. Spices. Seasoning mixes without salt. Unsalted popcorn and pretzels. Fat-free sweets. What foods are not recommended? The items listed may not be a complete list. Talk with your dietitian about what dietary choices are best for you. Grains Baked goods made with fat,  such as croissants, muffins, or some breads. Dry pasta or rice meal packs. Vegetables Creamed or fried vegetables. Vegetables in a cheese sauce. Regular canned vegetables (not low-sodium or reduced-sodium). Regular canned tomato sauce and paste (not low-sodium or reduced-sodium). Regular tomato and vegetable juice (not low-sodium or reduced-sodium). Angie Fava. Olives. Fruits Canned fruit in a light or heavy syrup. Fried fruit. Fruit in cream or butter sauce. Meat and other protein foods Fatty cuts of meat. Ribs. Fried meat. Berniece Salines. Sausage. Bologna and other processed lunch meats. Salami. Fatback. Hotdogs. Bratwurst. Salted nuts and seeds. Canned beans with added salt. Canned or smoked fish. Whole eggs or egg yolks. Chicken or [place] with skin. Dairy Whole or 2% milk, cream, and half-and-half. Whole or full-fat cream cheese. Whole-fat or sweetened yogurt. Full-fat cheese. Nondairy creamers. Whipped toppings. Processed cheese and cheese spreads. Fats and oils Butter. Stick margarine. Lard. Shortening. Ghee. Bacon fat. Tropical oils, such as coconut, palm kernel, or palm oil. Seasoning and other foods Salted popcorn and pretzels. Onion salt, garlic salt, seasoned salt, table salt, and sea salt. Worcestershire sauce. Tartar sauce. Barbecue sauce. Teriyaki sauce. Soy sauce, including reduced-sodium. Steak sauce. Canned and packaged gravies. Fish sauce. Oyster sauce. Cocktail sauce. Horseradish that you find  on the shelf. Ketchup. Mustard. Meat flavorings and tenderizers. Bouillon cubes. Hot sauce and Tabasco sauce. Premade or packaged marinades. Premade or packaged taco seasonings. Relishes. Regular salad dressings. Where to find more information:  National Heart, Lung, and Blood Institute: PopSteam.iswww.nhlbi.nih.gov  American Heart Association: www.heart.org Summary  The DASH eating plan is a healthy eating plan that has been shown to reduce high blood pressure (hypertension). It may also reduce your risk for  type 2 diabetes, heart disease, and stroke.  With the DASH eating plan, you should limit salt (sodium) intake to 2,300 mg a day. If you have hypertension, you may need to reduce your sodium intake to 1,500 mg a day.  When on the DASH eating plan, aim to eat more fresh fruits and vegetables, whole grains, lean proteins, low-fat dairy, and heart-healthy fats.  Work with your health care provider or diet and nutrition specialist (dietitian) to adjust your eating plan to your individual calorie needs. This information is not intended to replace advice given to you by your health care provider. Make sure you discuss any questions you have with your health care provider. Document Released: 07/31/2011 Document Revised: 08/04/2016 Document Reviewed: 08/04/2016 Elsevier Interactive Patient Education  [employer]2018 Elsevier Inc.

## 2018-08-11 ENCOUNTER — Encounter: Payer: Self-pay | Admitting: Internal Medicine

## 2018-08-11 ENCOUNTER — Ambulatory Visit (INDEPENDENT_AMBULATORY_CARE_PROVIDER_SITE_OTHER): Payer: BLUE CROSS/BLUE SHIELD | Admitting: Internal Medicine

## 2018-08-11 VITALS — BP 142/90 | HR 94 | Temp 98.1°F | Wt 169.4 lb

## 2018-08-11 DIAGNOSIS — F900 Attention-deficit hyperactivity disorder, predominantly inattentive type: Secondary | ICD-10-CM

## 2018-08-11 DIAGNOSIS — R7302 Impaired glucose tolerance (oral): Secondary | ICD-10-CM

## 2018-08-11 DIAGNOSIS — F411 Generalized anxiety disorder: Secondary | ICD-10-CM | POA: Diagnosis not present

## 2018-08-11 DIAGNOSIS — I10 Essential (primary) hypertension: Secondary | ICD-10-CM

## 2018-08-11 MED ORDER — HYDROCHLOROTHIAZIDE 25 MG PO TABS: 25.0000 mg | ORAL_TABLET | Freq: Every day | ORAL | 3 refills | Status: DC

## 2018-08-11 MED ORDER — HYDROCODONE-ACETAMINOPHEN 5-325 MG PO TABS: ORAL_TABLET | ORAL | 0 refills | Status: DC

## 2018-08-11 MED ORDER — ALPRAZOLAM 0.5 MG PO TABS: 0.5000 mg | ORAL_TABLET | Freq: Every day | ORAL | 0 refills | Status: DC

## 2018-08-11 NOTE — Progress Notes (Signed)
Established Patient Office Visit     CC/Reason for Visit: Blood pressure follow-up  HPI: Shaun Barron is a 42 y.o. male who is coming in today for the above mentioned reasons.  He was noted to be hypertensive on previous visits.  He was asked to take blood pressures at home and return today for follow-up.  Blood pressure today is 142/90.  He states that ambulatory blood pressure monitoring has been pretty consistent at approximately 160/80-90.  He denies chest pain, shortness of breath, blurry vision, headache.  His creatinine was 0.8 in November 2019.  He also is requesting refills of Xanax and hydrocodone.  He has not yet made appointment with psychiatry/behavioral health as advised on prior visit.  Past Medical/Surgical History: Past Medical History:  Diagnosis Date  . ALLERGIC RHINITIS 10/09/2008  . ANXIETY 10/09/2008  . GYNECOMASTIA 04/16/2010  . SHOULDER PAIN, LEFT, CHRONIC 10/19/2008    Past Surgical History:  Procedure Laterality Date  . LASIK      Social History:  reports that he quit smoking about 12 years ago. He has never used smokeless tobacco. He reports current alcohol use. He reports that he does not use drugs.  Allergies: No Known Allergies  Family History:  Family History  Problem Relation Age of Onset  . Coronary artery disease Father        bilat carotid artery stenosis:s/p CABG  . Cancer Mother        hx multiple myeloma-status post stem transpantation July 2008 and presently in remission     Current Outpatient Medications:  .  ALPRAZolam (XANAX) 0.5 MG tablet, Take 1 tablet (0.5 mg total) by mouth at bedtime., Disp: 30 tablet, Rfl: 0 .  amphetamine-dextroamphetamine (ADDERALL) 10 MG tablet, Take 1 tablet (10 mg total) by mouth 3 (three) times daily as needed., Disp: 90 tablet, Rfl: 0 .  amphetamine-dextroamphetamine (ADDERALL) 10 MG tablet, Take 1 tablet (10 mg total) by mouth 3 (three) times daily as needed., Disp: 90 tablet, Rfl: 0 .   amphetamine-dextroamphetamine (ADDERALL) 10 MG tablet, Take 1 tablet (10 mg total) by mouth 3 (three) times daily as needed., Disp: 90 tablet, Rfl: 0 .  HYDROcodone-acetaminophen (NORCO/VICODIN) 5-325 MG tablet, TAKE ONE TABLET BY MOUTH EVERY 6 HOURS AS NEEDED FOR PAIN, Disp: 30 tablet, Rfl: 0 .  hydrochlorothiazide (HYDRODIURIL) 25 MG tablet, Take 1 tablet (25 mg total) by mouth daily., Disp: 90 tablet, Rfl: 3  Review of Systems:  Constitutional: Denies fever, chills, diaphoresis, appetite change and fatigue.  HEENT: Denies photophobia, eye pain, redness, hearing loss, ear pain, congestion, sore throat, rhinorrhea, sneezing, mouth sores, trouble swallowing, neck pain, neck stiffness and tinnitus.   Respiratory: Denies SOB, DOE, cough, chest tightness,  and wheezing.   Cardiovascular: Denies chest pain, palpitations and leg swelling.  Gastrointestinal: Denies nausea, vomiting, abdominal pain, diarrhea, constipation, blood in stool and abdominal distention.  Genitourinary: Denies dysuria, urgency, frequency, hematuria, flank pain and difficulty urinating.  Endocrine: Denies: hot or cold intolerance, sweats, changes in hair or nails, polyuria, polydipsia. Musculoskeletal: Denies myalgias, back pain, joint swelling, arthralgias and gait problem.  Skin: Denies pallor, rash and wound.  Neurological: Denies dizziness, seizures, syncope, weakness, light-headedness, numbness and headaches.  Hematological: Denies adenopathy. Easy bruising, personal or family bleeding history  Psychiatric/Behavioral: Denies suicidal ideation, mood changes, confusion, nervousness, sleep disturbance and agitation    Physical Exam: Vitals:   08/11/18 0806  BP: (!) 142/90  Pulse: 94  Temp: 98.1 F (36.7 C)  TempSrc:  Oral  SpO2: 97%  Weight: 169 lb 6.4 oz (76.8 kg)    Body mass index is 27.34 kg/m.   Constitutional: NAD, calm, comfortable Eyes: PERRL, lids and conjunctivae normal ENMT: Mucous membranes are  moist.  Respiratory: clear to auscultation bilaterally, no wheezing, no crackles. Normal respiratory effort. No accessory muscle use.  Cardiovascular: Regular rate and rhythm, no murmurs / rubs / gallops. No extremity edema. 2+ pedal pulses. No carotid bruits.  Abdomen: no tenderness, no masses palpated. No hepatosplenomegaly. Bowel sounds positive.  Musculoskeletal: no clubbing / cyanosis. No joint deformity upper and lower extremities. Good ROM, no contractures. Normal muscle tone.  Neurologic: Grossly intact and nonfocal Psychiatric: Normal judgment and insight. Alert and oriented x 3. Normal mood.    Impression and Plan:  Attention deficit hyperactivity disorder (ADHD), predominantly inattentive type -Is on treatment with Adderall.  He has been advised follow-up with behavioral health for continued management.  Essential hypertension  -I feel confident in making this diagnosis with him being repeatedly hypertensive and multiple office visits, ED visits as well as home BP monitoring. -We will start hydrochlorothiazide 25 mg daily. -He will return to clinic in 6 to 8 weeks for continued blood pressure management and repeat basic metabolic profile to check renal function and electrolytes.  Anxiety state -As stated during previous visit, I do not feel comfortable continuing alprazolam prescription for him. -He has been advised that I will give him 30 tablets today, no further prescriptions of benzodiazepines will come through this office unless it is to initiate a taper.  This should give him enough time to secure follow-up with psychiatry.  IGT (impaired glucose tolerance) -Excellent A1c in November down to 5.0. -Have continue to counsel on healthy lifestyle.    Patient Instructions  -It was nice seeing you today.  -Start HCTZ 25 mg one tablet daily for treatment of your high blood pressure.  -Please schedule follow up in 6-8 weeks for continued blood pressure management.  -Will  provide 1 more month of alprazolam and hydrocodone. Further refills, if deemed necesary, will need to come from psychiatry.     Lelon Frohlich, MD [place] [facility]

## 2018-08-11 NOTE — Patient Instructions (Signed)
-  It was nice seeing you today.  -Start HCTZ 25 mg one tablet daily for treatment of your high blood pressure.  -Please schedule follow up in 6-8 weeks for continued blood pressure management.  -Will provide 1 more month of alprazolam and hydrocodone. Further refills, if deemed necesary, will need to come from psychiatry.

## 2018-10-05 ENCOUNTER — Encounter: Payer: Self-pay | Admitting: Internal Medicine

## 2018-10-05 ENCOUNTER — Other Ambulatory Visit: Payer: Self-pay

## 2018-10-05 ENCOUNTER — Ambulatory Visit (INDEPENDENT_AMBULATORY_CARE_PROVIDER_SITE_OTHER): Payer: BLUE CROSS/BLUE SHIELD | Admitting: Internal Medicine

## 2018-10-05 VITALS — BP 130/90 | HR 120 | Temp 98.3°F | Wt 161.1 lb

## 2018-10-05 DIAGNOSIS — F900 Attention-deficit hyperactivity disorder, predominantly inattentive type: Secondary | ICD-10-CM | POA: Diagnosis not present

## 2018-10-05 DIAGNOSIS — F411 Generalized anxiety disorder: Secondary | ICD-10-CM | POA: Diagnosis not present

## 2018-10-05 DIAGNOSIS — I10 Essential (primary) hypertension: Secondary | ICD-10-CM

## 2018-10-05 DIAGNOSIS — R52 Pain, unspecified: Secondary | ICD-10-CM

## 2018-10-05 MED ORDER — LISINOPRIL 5 MG PO TABS: 5.0000 mg | ORAL_TABLET | Freq: Every day | ORAL | 3 refills | Status: DC

## 2018-10-05 MED ORDER — ALPRAZOLAM 0.5 MG PO TABS: 0.5000 mg | ORAL_TABLET | Freq: Every day | ORAL | 0 refills | Status: DC

## 2018-10-05 MED ORDER — AMPHETAMINE-DEXTROAMPHETAMINE 10 MG PO TABS: 10.0000 mg | ORAL_TABLET | Freq: Three times a day (TID) | ORAL | 0 refills | Status: DC | PRN

## 2018-10-05 MED ORDER — HYDROCODONE-ACETAMINOPHEN 5-325 MG PO TABS: ORAL_TABLET | ORAL | 0 refills | Status: DC

## 2018-10-05 NOTE — Patient Instructions (Addendum)
Great seeing you again.  Instructions: -cont taking HCTZ daily -Start taking lisinopril 5mg  daily -Follow up in 8 weeks for blood pressure management -Refills of adderall and alprazolam have been sent today. We will determine further prescription needs based on psychiatry recommendations.

## 2018-10-05 NOTE — Progress Notes (Signed)
Established Patient Office Visit     CC/Reason for Visit: blood pressure f/u  HPI: Shaun Barron is a 43 y.o. male who is coming in today for the above mentioned reason. Past Medical History is significant for allergies, adhd, anxiety, chronic shoulder pain and gynecomastia.  Patient has been taking his medications as prescribed and has no adverse symptoms or complaints.  He sts he feels fine, other than some anxiety.  He made an appt with [facility] in 3 weeks to see psych, so he can be assessed and obtain his psych medications through them.     Past Medical/Surgical History: Past Medical History:  Diagnosis Date  . ALLERGIC RHINITIS 10/09/2008  . ANXIETY 10/09/2008  . GYNECOMASTIA 04/16/2010  . SHOULDER PAIN, LEFT, CHRONIC 10/19/2008    Past Surgical History:  Procedure Laterality Date  . LASIK      Social History:  reports that he quit smoking about 13 years ago. He has never used smokeless tobacco. He reports current alcohol use. He reports that he does not use drugs.  Allergies: No Known Allergies  Family History:  Family History  Problem Relation Age of Onset  . Coronary artery disease Father        bilat carotid artery stenosis:s/p CABG  . Cancer Mother        hx multiple myeloma-status post stem transpantation July 2008 and presently in remission     Current Outpatient Medications:  .  ALPRAZolam (XANAX) 0.5 MG tablet, Take 1 tablet (0.5 mg total) by mouth at bedtime., Disp: 30 tablet, Rfl: 0 .  amphetamine-dextroamphetamine (ADDERALL) 10 MG tablet, Take 1 tablet (10 mg total) by mouth 3 (three) times daily as needed., Disp: 90 tablet, Rfl: 0 .  amphetamine-dextroamphetamine (ADDERALL) 10 MG tablet, Take 1 tablet (10 mg total) by mouth 3 (three) times daily as needed., Disp: 90 tablet, Rfl: 0 .  amphetamine-dextroamphetamine (ADDERALL) 10 MG tablet, Take 1 tablet (10 mg total) by mouth 3 (three) times daily as needed., Disp: 90 tablet, Rfl: 0 .   hydrochlorothiazide (HYDRODIURIL) 25 MG tablet, Take 1 tablet (25 mg total) by mouth daily., Disp: 90 tablet, Rfl: 3 .  HYDROcodone-acetaminophen (NORCO/VICODIN) 5-325 MG tablet, TAKE ONE TABLET BY MOUTH EVERY 6 HOURS AS NEEDED FOR PAIN, Disp: 30 tablet, Rfl: 0 .  lisinopril (PRINIVIL,ZESTRIL) 5 MG tablet, Take 1 tablet (5 mg total) by mouth daily., Disp: 90 tablet, Rfl: 3  Review of Systems:  Constitutional: Denies fever, chills, diaphoresis, appetite change and fatigue.  HEENT: Denies photophobia, eye pain, redness, hearing loss, ear pain, congestion, sore throat, rhinorrhea, sneezing, mouth sores, trouble swallowing, neck pain, neck stiffness and tinnitus.   Respiratory: Denies SOB, DOE, cough, chest tightness,  and wheezing.   Cardiovascular: Denies chest pain, palpitations and leg swelling.  Gastrointestinal: Denies nausea, vomiting, abdominal pain, diarrhea, constipation, blood in stool and abdominal distention.  Genitourinary: Denies dysuria, urgency, frequency, hematuria, flank pain and difficulty urinating.  Endocrine: Denies: hot or cold intolerance, sweats, changes in hair or nails, polyuria, polydipsia. Musculoskeletal: Denies myalgias, back pain, joint swelling, arthralgias and gait problem.  Skin: Denies pallor, rash and wound.  Neurological: Denies dizziness, seizures, syncope, weakness, light-headedness, numbness and headaches.  Hematological: Denies adenopathy. Easy bruising, personal or family bleeding history  Psychiatric/Behavioral: Denies suicidal ideation, mood changes, confusion, sleep disturbance and agitation  C/o anxiety   Physical Exam: Vitals:   10/05/18 0812  BP: 130/90  Pulse: (!) 120  Temp: 98.3 F (36.8 C)  TempSrc:  Oral  SpO2: 98%  Weight: 161 lb 1.6 oz (73.1 kg)    Body mass index is 26 kg/m.   Constitutional: NAD, calm, comfortable Eyes: PERRL, lids and conjunctivae normal Respiratory: clear to auscultation bilaterally, no wheezing, no crackles.  Normal respiratory effort. No accessory muscle use.  Cardiovascular: Regular rate and rhythm, no murmurs / rubs / gallops. No extremity edema. 2+ pedal pulses.  Psychiatric: Normal judgment and insight. Alert and oriented x 3. Normal mood.    Impression and Plan:  Essential hypertension - Plan: lisinopril (PRINIVIL,ZESTRIL) 5 MG tablet in addition to HCTZ 25 mg. -will return in 6-8 weeks for BP check.  Anxiety state  Attention deficit hyperactivity disorder (ADHD), predominantly inattentive type -  Encounter for pain management  -As he has made appt with psych, will refill adderall, alprazolam and hydrocodone for 30 days. Further Rx is dependant on psych recommendations.    Patient Instructions  Great seeing you again.  Instructions: -cont taking HCTZ daily -Start taking lisinopril 75m daily -Follow up in 8 weeks for blood pressure management -Refills of adderall and alprazolam have been sent today. We will determine further prescription needs based on psychiatry recommendations.      AEnzo Bi RN DNP Student  [facility]

## 2018-10-19 ENCOUNTER — Ambulatory Visit: Payer: BLUE CROSS/BLUE SHIELD | Admitting: Psychiatry

## 2018-11-05 ENCOUNTER — Ambulatory Visit: Payer: BLUE CROSS/BLUE SHIELD | Admitting: Psychiatry

## 2018-11-08 ENCOUNTER — Other Ambulatory Visit: Payer: Self-pay | Admitting: Internal Medicine

## 2018-11-08 DIAGNOSIS — F411 Generalized anxiety disorder: Secondary | ICD-10-CM

## 2018-11-08 DIAGNOSIS — R52 Pain, unspecified: Secondary | ICD-10-CM

## 2018-11-08 DIAGNOSIS — F900 Attention-deficit hyperactivity disorder, predominantly inattentive type: Secondary | ICD-10-CM

## 2018-11-09 NOTE — Telephone Encounter (Signed)
-  Refills of adderall and alprazolam have been sent today. We will determine further prescription needs based on psychiatry recommendations.  Okay to fill?

## 2018-11-16 ENCOUNTER — Encounter: Payer: Self-pay | Admitting: Internal Medicine

## 2018-11-16 DIAGNOSIS — F900 Attention-deficit hyperactivity disorder, predominantly inattentive type: Secondary | ICD-10-CM

## 2018-11-16 MED ORDER — AMPHETAMINE-DEXTROAMPHETAMINE 10 MG PO TABS: 10.0000 mg | ORAL_TABLET | Freq: Three times a day (TID) | ORAL | 0 refills | Status: DC | PRN

## 2018-11-22 ENCOUNTER — Ambulatory Visit: Payer: BLUE CROSS/BLUE SHIELD | Admitting: Psychiatry

## 2018-11-29 ENCOUNTER — Ambulatory Visit: Payer: BLUE CROSS/BLUE SHIELD | Admitting: Physician Assistant

## 2018-11-29 ENCOUNTER — Telehealth: Payer: Self-pay | Admitting: Physician Assistant

## 2018-11-29 NOTE — Telephone Encounter (Signed)
Unable to reach him for phone appt.  Tried 3 times.  On 3rd msg, I asked him to call office to r/s.

## 2018-11-30 ENCOUNTER — Other Ambulatory Visit: Payer: Self-pay

## 2018-11-30 ENCOUNTER — Ambulatory Visit (INDEPENDENT_AMBULATORY_CARE_PROVIDER_SITE_OTHER): Payer: BLUE CROSS/BLUE SHIELD | Admitting: Internal Medicine

## 2018-11-30 DIAGNOSIS — F411 Generalized anxiety disorder: Secondary | ICD-10-CM

## 2018-11-30 DIAGNOSIS — I10 Essential (primary) hypertension: Secondary | ICD-10-CM | POA: Diagnosis not present

## 2018-11-30 DIAGNOSIS — R52 Pain, unspecified: Secondary | ICD-10-CM

## 2018-11-30 MED ORDER — HYDROCODONE-ACETAMINOPHEN 5-325 MG PO TABS: ORAL_TABLET | ORAL | 0 refills | Status: DC

## 2018-11-30 MED ORDER — ALPRAZOLAM 0.5 MG PO TABS: 0.5000 mg | ORAL_TABLET | Freq: Every day | ORAL | 2 refills | Status: DC

## 2018-11-30 NOTE — Progress Notes (Signed)
  Virtual Visit via Video Note  I connected with Shaun Barron on 11/30/18 at  9:15 AM EDT by a video enabled telemedicine application and verified that I am speaking with the correct person using two identifiers.  Location patient: home Location provider: work office Persons participating in the virtual visit: patient, provider  I discussed the limitations of evaluation and management by telemedicine and the availability of in person appointments. The patient expressed understanding and agreed to proceed.   HPI: He is due for medications refills, but the main purpose of this visit is to follow up on BP.  Last visit we added lisinopril 5 mg to HCTZ 25 mg, due to persistently elevated BP. He started feeling dizzy when standing up and his BP was consistently around 90/60 so he stopped taking lisinopril. Is now on HCTZ only and BP has been 105-110/70 and dizziness has resolved.  His psych visit was postponed due to COVID-19.   ROS: Constitutional: Denies fever, chills, diaphoresis, appetite change and fatigue.  HEENT: Denies photophobia, eye pain, redness, hearing loss, ear pain, congestion, sore throat, rhinorrhea, sneezing, mouth sores, trouble swallowing, neck pain, neck stiffness and tinnitus.   Respiratory: Denies SOB, DOE, cough, chest tightness,  and wheezing.   Cardiovascular: Denies chest pain, palpitations and leg swelling.  Gastrointestinal: Denies nausea, vomiting, abdominal pain, diarrhea, constipation, blood in stool and abdominal distention.  Genitourinary: Denies dysuria, urgency, frequency, hematuria, flank pain and difficulty urinating.  Endocrine: Denies: hot or cold intolerance, sweats, changes in hair or nails, polyuria, polydipsia. Musculoskeletal: Denies myalgias, back pain, joint swelling, arthralgias and gait problem.  Skin: Denies pallor, rash and wound.  Neurological: Denies dizziness, seizures, syncope, weakness, light-headedness, numbness and headaches.   Hematological: Denies adenopathy. Easy bruising, personal or family bleeding history  Psychiatric/Behavioral: Denies suicidal ideation, mood changes, confusion, nervousness, sleep disturbance and agitation   Past Medical History:  Diagnosis Date  . ALLERGIC RHINITIS 10/09/2008  . ANXIETY 10/09/2008  . GYNECOMASTIA 04/16/2010  . SHOULDER PAIN, LEFT, CHRONIC 10/19/2008    Past Surgical History:  Procedure Laterality Date  . LASIK      Family History  Problem Relation Age of Onset  . Coronary artery disease Father        bilat carotid artery stenosis:s/p CABG  . Cancer Mother        hx multiple myeloma-status post stem transpantation July 2008 and presently in remission    SOCIAL HX:   reports that he quit smoking about 13 years ago. He has never used smokeless tobacco. He reports current alcohol use. He reports that he does not use drugs.   Current Outpatient Medications:  .  ALPRAZolam (XANAX) 0.5 MG tablet, Take 1 tablet (0.5 mg total) by mouth at bedtime., Disp: 30 tablet, Rfl: 2 .  amphetamine-dextroamphetamine (ADDERALL) 10 MG tablet, Take 1 tablet (10 mg total) by mouth 3 (three) times daily as needed., Disp: 90 tablet, Rfl: 0 .  amphetamine-dextroamphetamine (ADDERALL) 10 MG tablet, Take 1 tablet (10 mg total) by mouth 3 (three) times daily as needed., Disp: 90 tablet, Rfl: 0 .  amphetamine-dextroamphetamine (ADDERALL) 10 MG tablet, Take 1 tablet (10 mg total) by mouth 3 (three) times daily as needed., Disp: 90 tablet, Rfl: 0 .  hydrochlorothiazide (HYDRODIURIL) 25 MG tablet, Take 1 tablet (25 mg total) by mouth daily., Disp: 90 tablet, Rfl: 3 .  HYDROcodone-acetaminophen (NORCO/VICODIN) 5-325 MG tablet, TAKE ONE TABLET BY MOUTH EVERY 6 HOURS AS NEEDED FOR PAIN, Disp: 15 tablet, Rfl: 0    EXAM:   VITALS per patient if applicable: BP 105-110/70  GENERAL: alert, oriented, appears well and in no acute distress  HEENT: atraumatic, conjunttiva clear, no obvious abnormalities on  inspection of external nose and ears  NECK: normal movements of the head and neck  LUNGS: on inspection no signs of respiratory distress, breathing rate appears normal, no obvious gross increased work of breathing, gasping or wheezing  CV: no obvious cyanosis  MS: moves all visible extremities without noticeable abnormality  PSYCH/NEURO: pleasant and cooperative, no obvious depression or anxiety, speech and thought processing grossly intact  ASSESSMENT AND PLAN:   Essential hypertension -He quit taking lisinopril due to symptomatic low BP, agree. -Will continue HCTZ only for now.  Anxiety state -As his visit with psych was postponed, will refill xanax for now 30 tabs/mo for 3 months. -If psych decides not to refill, will initiate a taper.  Encounter for pain management  -We have cut him down to 15 tabs a month. -Will continue to wean. -PDMP reviewed, no red flags, I am only recent prescriber, OD risk score is 170.     I discussed the assessment and treatment plan with the patient. The patient was provided an opportunity to ask questions and all were answered. The patient agreed with the plan and demonstrated an understanding of the instructions.   The patient was advised to call back or seek an in-person evaluation if the symptoms worsen or if the condition fails to improve as anticipated.    Estela Hernandez Acosta, MD  [facility]  

## 2018-12-17 ENCOUNTER — Other Ambulatory Visit: Payer: Self-pay | Admitting: Internal Medicine

## 2018-12-17 DIAGNOSIS — F900 Attention-deficit hyperactivity disorder, predominantly inattentive type: Secondary | ICD-10-CM

## 2018-12-20 ENCOUNTER — Other Ambulatory Visit: Payer: Self-pay | Admitting: Internal Medicine

## 2018-12-20 ENCOUNTER — Encounter: Payer: Self-pay | Admitting: Physician Assistant

## 2018-12-20 ENCOUNTER — Ambulatory Visit (INDEPENDENT_AMBULATORY_CARE_PROVIDER_SITE_OTHER): Payer: BLUE CROSS/BLUE SHIELD | Admitting: Physician Assistant

## 2018-12-20 ENCOUNTER — Other Ambulatory Visit: Payer: Self-pay

## 2018-12-20 DIAGNOSIS — F411 Generalized anxiety disorder: Secondary | ICD-10-CM

## 2018-12-20 DIAGNOSIS — F902 Attention-deficit hyperactivity disorder, combined type: Secondary | ICD-10-CM | POA: Diagnosis not present

## 2018-12-20 MED ORDER — HYDROXYZINE HCL 25 MG PO TABS: 12.5000 mg | ORAL_TABLET | Freq: Three times a day (TID) | ORAL | 0 refills | Status: DC | PRN

## 2018-12-20 MED ORDER — AMPHETAMINE-DEXTROAMPHETAMINE 10 MG PO TABS: 10.0000 mg | ORAL_TABLET | Freq: Three times a day (TID) | ORAL | 0 refills | Status: DC

## 2018-12-20 MED ORDER — ALPRAZOLAM 0.5 MG PO TABS: 0.5000 mg | ORAL_TABLET | Freq: Every evening | ORAL | 0 refills | Status: DC | PRN

## 2018-12-20 NOTE — Telephone Encounter (Signed)
Spoke with patient and he will call the pharmacy to see if prescription is ready for pick up and call back if needed.

## 2018-12-20 NOTE — Telephone Encounter (Signed)
Requested medication (s) are due for refill today: yes  Requested medication (s) are on the active medication list: yes  Last refill:  11/16/18  Future visit scheduled: yes  Notes to clinic:  Medication not delegated to NT to refill                           UDS not completed in the last 360 days   Requested Prescriptions  Pending Prescriptions Disp Refills   amphetamine-dextroamphetamine (ADDERALL) 10 MG tablet 90 tablet 0    Sig: Take 1 tablet (10 mg total) by mouth 3 (three) times daily as needed.     Not Delegated - Psychiatry:  Stimulants/ADHD Failed - 12/20/2018  3:06 PM      Failed - This refill cannot be delegated      Failed - Urine Drug Screen completed in last 360 days.      Passed - Valid encounter within last 3 months    Recent Outpatient Visits          2 weeks ago Essential hypertension   Adult nurse [facility], Limmie Patricia, MD   2 months ago Essential hypertension   [facility] conservation officer at [facility], Limmie Patricia, MD   4 months ago Attention deficit hyperactivity disorder (ADHD), predominantly inattentive type   Nature conservation officer at [facility], Limmie Patricia, MD   5 months ago Attention deficit hyperactivity disorder (ADHD), predominantly inattentive type   Nature conservation officer at [facility], Limmie Patricia, MD   9 months ago Encounter for preventive health examination   [facility], Janett Labella, MD      Future Appointments            In 2 months Philip Aspen, Limmie Patricia, MD [facility] [place], [facility]

## 2018-12-20 NOTE — Progress Notes (Signed)
[facility] MD/PA/NP Initial Note  12/20/18 5:34 PM Shaun Barron  MRN:  [id number]  Chief Complaint:  Chief Complaint    Follow-up     Virtual Visit via Telephone Note  I connected with patient by a video enabled telemedicine application or telephone, with their informed consent, and verified patient privacy and that I am speaking with the correct person using two identifiers.  I am private, in my home and the patient is home.  I discussed the limitations, risks, security and privacy concerns of performing an evaluation and management service by telephone and the availability of in person appointments. I also discussed with the patient that there may be a patient responsible charge related to this service. The patient expressed understanding and agreed to proceed.   I discussed the assessment and treatment plan with the patient. The patient was provided an opportunity to ask questions and all were answered. The patient agreed with the plan and demonstrated an understanding of the instructions.   The patient was advised to call back or seek an in-person evaluation if the symptoms worsen or if the condition fails to improve as anticipated.  I provided  15  minutes of non-face-to-face time during this encounter.  HPI: Presents to find a new provider for his psychiatric meds.  His PCP has been prescribing his Adderall and Xanax until he can get into see someone here.  He has had ADHD since he was around fifth grade.  He has taken Ritalin for this in the past but now is on Adderall and it works very well.  He is able to focus and finish tasks to completion.  He denies any side effects such as high blood pressure, palpitations or tachycardia, and no sleep disturbances.  He also has generalized anxiety ongoing for the past 6 years or so.  It started when his mom passed away.  He has been taking Xanax off and on for that.  It is helpful as well.  He occasionally has panic attacks but his symptoms  are more generalized.  He rarely has palpitations, sweaty palms, chest tightness or other symptoms.  He denies anhedonia, decreased energy or motivation, he is not isolating except for what is required due to the coronavirus pandemic.  He does not cry easily.  Patient denies increased energy with decreased need for sleep, no increased talkativeness, no racing thoughts, no impulsivity or risky behaviors, no increased spending, no increased libido, no grandiosity.  Visit Diagnosis:    ICD-10-CM   1. Attention deficit hyperactivity disorder (ADHD), combined type F90.2   2. Anxiety state F41.1 ALPRAZolam (XANAX) 0.5 MG tablet    Past Psychiatric History:  Dx with ADHD around 43 y.o. Also anxiety for about 6 years or so, since his mom died.   No hosp, never cut/burn. No suicide attempt.   Past medications for mental health diagnoses include: Ritalin, Valium, Xanax, Klonopin, Prozac, Zoloft   Past Medical History:  Past Medical History:  Diagnosis Date  . ADHD (attention deficit hyperactivity disorder)   . ALLERGIC RHINITIS 10/09/2008  . ANXIETY 10/09/2008  . GYNECOMASTIA 04/16/2010  . SHOULDER PAIN, LEFT, CHRONIC 10/19/2008    Past Surgical History:  Procedure Laterality Date  . LASIK      Family Psychiatric History: see below  Family History:  Family History  Problem Relation Age of Onset  . Coronary artery disease Father        bilat carotid artery stenosis:s/p CABG  . Cancer Mother  hx multiple myeloma-status post stem transpantation July 2008 and presently in remission  . Coronary artery disease Maternal Grandfather   . Breast cancer Maternal Grandmother   . Leukemia Paternal Grandmother     Social History:  Social History   Socioeconomic History  . Marital status: Divorced    Spouse name: Not on file  . Number of children: 2  . Years of education: Not on file  . Highest education level: Some college, no degree  Occupational History  . Occupation: Emergency planning/management officer: [employer]  Social Needs  . Financial resource strain: Not hard at all  . Food insecurity:    Worry: Never true    Inability: Never true  . Transportation needs:    Medical: No    Non-medical: No  Tobacco Use  . Smoking status: Former Smoker    Last attempt to quit: 09/25/2005    Years since quitting: 13.2  . Smokeless tobacco: Never Used  Substance and Sexual Activity  . Alcohol use: Yes    Alcohol/week: 4.0 standard drinks    Types: 2 Glasses of wine, 2 Cans of beer per week  . Drug use: No  . Sexual activity: Not on file  Lifestyle  . Physical activity:    Days per week: 5 days    Minutes per session: 50 min  . Stress: Very much  Relationships  . Social connections:    Talks on phone: More than three times a week    Gets together: More than three times a week    Attends religious service: More than 4 times per year    Active member of club or organization: No    Attends meetings of clubs or organizations: Never    Relationship status: Divorced  Other Topics Concern  . Not on file  Social History Narrative   Lives w/ GF. In process of divorcing after 22 years.  Has 88 yo dtr, 54 yo son.       Grew up in [place], [place].  Parents divorced when he was around 49 yo.  Never abused.       One step sister and one step brother, both in good health      Works in Office manager.       43 yo had DWI. "I learned my lesson."      Christian      Caffeine 1 cup coffee    Allergies: No Known Allergies  Metabolic Disorder Labs: Lab Results  Component Value Date   HGBA1C 5.0 07/14/2018   No results found for: PROLACTIN Lab Results  Component Value Date   CHOL 116 03/22/2018   TRIG 58.0 03/22/2018   HDL 48.80 03/22/2018   CHOLHDL 2 03/22/2018   VLDL 11.6 03/22/2018   LDLCALC 55 03/22/2018   LDLCALC 59 12/24/2015   Lab Results  Component Value Date   TSH 1.04 03/22/2018   TSH 1.00 12/24/2015    Therapeutic Level Labs: No results found for:  LITHIUM No results found for: VALPROATE No components found for:  CBMZ  Current Medications: Current Outpatient Medications  Medication Sig Dispense Refill  . ALPRAZolam (XANAX) 0.5 MG tablet Take 1 tablet (0.5 mg total) by mouth at bedtime as needed for anxiety. 15 tablet 0  . amphetamine-dextroamphetamine (ADDERALL) 10 MG tablet Take 1 tablet (10 mg total) by mouth 3 (three) times daily as needed. 90 tablet 0  . amphetamine-dextroamphetamine (ADDERALL) 10 MG tablet Take 1 tablet (10 mg total) by mouth 3 (  three) times daily as needed. 90 tablet 0  . amphetamine-dextroamphetamine (ADDERALL) 10 MG tablet Take 1 tablet (10 mg total) by mouth 3 (three) times daily as needed. 90 tablet 0  . hydrochlorothiazide (HYDRODIURIL) 25 MG tablet Take 1 tablet (25 mg total) by mouth daily. 90 tablet 3  . HYDROcodone-acetaminophen (NORCO/VICODIN) 5-325 MG tablet TAKE ONE TABLET BY MOUTH EVERY 6 HOURS AS NEEDED FOR PAIN 15 tablet 0  . amphetamine-dextroamphetamine (ADDERALL) 10 MG tablet Take 1 tablet (10 mg total) by mouth 3 (three) times daily. 90 tablet 0  . hydrOXYzine (ATARAX/VISTARIL) 25 MG tablet Take 0.5-1 tablets (12.5-25 mg total) by mouth every 8 (eight) hours as needed. 60 tablet 0   No current facility-administered medications for this visit.     Medication Side Effects: none  Orders placed this visit:  No orders of the defined types were placed in this encounter.   Psychiatric Specialty Exam:  Review of Systems  Constitutional: Negative.   HENT: Negative.   Eyes: Negative.   Respiratory: Negative.   Cardiovascular: Negative.   Gastrointestinal: Negative.   Genitourinary: Negative.   Musculoskeletal: Negative.   Skin: Negative.   Neurological: Negative.   Endo/Heme/Allergies: Negative.   Psychiatric/Behavioral: The patient has insomnia.     There were no vitals taken for this visit.There is no height or weight on file to calculate BMI.  General Appearance: Phone visit, unable to  assess  Eye Contact:  Phone visit unable to assess  Speech:  Clear and Coherent  Volume:  Normal  Mood:  Euthymic  Affect:  Unable to assess  Thought Process:  Goal Directed  Orientation:  Full (Time, Place, and Person)  Thought Content: Logical   Suicidal Thoughts:  No  Homicidal Thoughts:  No  Memory:  WNL  Judgement:  Good  Insight:  Good  Psychomotor Activity:  Unable to assess  Concentration:  Concentration: Good and Attention Span: Good  Recall:  Good  Fund of Knowledge: Good  Language: Good  Assets:  Desire for Improvement  ADL's:  Intact  Cognition: WNL  Prognosis:  Good   Screenings:  PHQ2-9     Office Visit from 03/22/2018 in [place] at [facility] from 06/29/2014 in [place] at [facility] Total Score  0  0      Receiving Psychotherapy: No   Treatment Plan/Recommendations: We discussed the fact that I do not routinely prescribe an upper, the Adderall, and a stimulant, Xanax, at the same time.  They basically cancel each other out.  He verbalizes understanding and states he needs the Adderall more than the Xanax.  I offered different options including Strattera, clonidine and he feels like treating the ADHD is most important. Continue Xanax 0.5 mg p.o. nightly as needed.  He will take sparingly. Start hydroxyzine 25 mg half to 1 every 8 hours as needed anxiety or sleep. Sleep hygiene were discussed. Discussed coping skills for anxiety. Return in 4 weeks.  Donnal Moat, PA-C   This record has been created using [employer].  Chart creation errors have been sought, but may not always have been located and corrected. Such creation errors do not reflect on the standard of medical care.

## 2018-12-28 DIAGNOSIS — Z1159 Encounter for screening for other viral diseases: Secondary | ICD-10-CM | POA: Diagnosis not present

## 2018-12-28 DIAGNOSIS — R197 Diarrhea, unspecified: Secondary | ICD-10-CM | POA: Diagnosis not present

## 2018-12-28 DIAGNOSIS — R6883 Chills (without fever): Secondary | ICD-10-CM | POA: Diagnosis not present

## 2018-12-28 DIAGNOSIS — Z20828 Contact with and (suspected) exposure to other viral communicable diseases: Secondary | ICD-10-CM | POA: Diagnosis not present

## 2018-12-28 DIAGNOSIS — B349 Viral infection, unspecified: Secondary | ICD-10-CM | POA: Diagnosis not present

## 2019-01-19 ENCOUNTER — Other Ambulatory Visit: Payer: Self-pay

## 2019-01-19 ENCOUNTER — Ambulatory Visit (INDEPENDENT_AMBULATORY_CARE_PROVIDER_SITE_OTHER): Payer: BLUE CROSS/BLUE SHIELD | Admitting: Physician Assistant

## 2019-01-19 ENCOUNTER — Encounter: Payer: Self-pay | Admitting: Physician Assistant

## 2019-01-19 DIAGNOSIS — F411 Generalized anxiety disorder: Secondary | ICD-10-CM | POA: Diagnosis not present

## 2019-01-19 DIAGNOSIS — F900 Attention-deficit hyperactivity disorder, predominantly inattentive type: Secondary | ICD-10-CM | POA: Diagnosis not present

## 2019-01-19 MED ORDER — AMPHETAMINE-DEXTROAMPHETAMINE 10 MG PO TABS: 10.0000 mg | ORAL_TABLET | Freq: Three times a day (TID) | ORAL | 0 refills | Status: DC | PRN

## 2019-01-19 MED ORDER — AMPHETAMINE-DEXTROAMPHETAMINE 10 MG PO TABS: 10.0000 mg | ORAL_TABLET | Freq: Three times a day (TID) | ORAL | 0 refills | Status: DC

## 2019-01-19 NOTE — Progress Notes (Signed)
[facility] Med Check  Patient ID: Shaun Barron,  MRN: [id number]  PCP: Philip AspenHernandez Acosta, Limmie PatriciaEstela Y, MD  Date of Evaluation: 01/19/2019 Time spent:15 minutes  Chief Complaint:  Chief Complaint    Anxiety; ADHD; Follow-up     Virtual Visit via Telephone Note  I connected with patient by a video enabled telemedicine application or telephone, with their informed consent, and verified patient privacy and that I am speaking with the correct person using two identifiers.  I am private, in my home and the patient is home.  I discussed the limitations, risks, security and privacy concerns of performing an evaluation and management service by telephone and the availability of in person appointments. I also discussed with the patient that there may be a patient responsible charge related to this service. The patient expressed understanding and agreed to proceed.   I discussed the assessment and treatment plan with the patient. The patient was provided an opportunity to ask questions and all were answered. The patient agreed with the plan and demonstrated an understanding of the instructions.   The patient was advised to call back or seek an in-person evaluation if the symptoms worsen or if the condition fails to improve as anticipated.  I provided 15 minutes of non-face-to-face time during this encounter.  HISTORY/CURRENT STATUS: HPI for 1 month med check.  At the last visit, we added hydroxyzine for anxiety and sleep.  He is only been taking it to help him sleep, which it has been effective.  He has not taking it during the day because he thought it was only to help sleep.  He has increased anxiety a couple of times a week since the last visit.  He is avoiding the Xanax.  The Adderall continues to work well. States that attention is good without easy distractibility.  Able to focus on things and finish tasks to completion.   Denies dizziness, syncope, seizures, numbness, tingling,  tremor, tics, unsteady gait, slurred speech, confusion. Denies muscle or joint pain, stiffness, or dystonia.  Individual Medical History/ Review of Systems: Changes? :No    Past medications for mental health diagnoses include: Ritalin, Valium, Xanax, Klonopin, Prozac, Zoloft  Allergies: Patient has no known allergies.  Current Medications:  Current Outpatient Medications:  .  ALPRAZolam (XANAX) 0.5 MG tablet, Take 1 tablet (0.5 mg total) by mouth at bedtime as needed for anxiety., Disp: 15 tablet, Rfl: 0 .  [START ON 03/19/2019] amphetamine-dextroamphetamine (ADDERALL) 10 MG tablet, Take 1 tablet (10 mg total) by mouth 3 (three) times daily as needed., Disp: 90 tablet, Rfl: 0 .  [START ON 02/18/2019] amphetamine-dextroamphetamine (ADDERALL) 10 MG tablet, Take 1 tablet (10 mg total) by mouth 3 (three) times daily as needed., Disp: 90 tablet, Rfl: 0 .  amphetamine-dextroamphetamine (ADDERALL) 10 MG tablet, Take 1 tablet (10 mg total) by mouth 3 (three) times daily., Disp: 90 tablet, Rfl: 0 .  hydrochlorothiazide (HYDRODIURIL) 25 MG tablet, Take 1 tablet (25 mg total) by mouth daily., Disp: 90 tablet, Rfl: 3 .  HYDROcodone-acetaminophen (NORCO/VICODIN) 5-325 MG tablet, TAKE ONE TABLET BY MOUTH EVERY 6 HOURS AS NEEDED FOR PAIN, Disp: 15 tablet, Rfl: 0 .  hydrOXYzine (ATARAX/VISTARIL) 25 MG tablet, Take 0.5-1 tablets (12.5-25 mg total) by mouth every 8 (eight) hours as needed., Disp: 60 tablet, Rfl: 0 .  amphetamine-dextroamphetamine (ADDERALL) 10 MG tablet, Take 1 tablet (10 mg total) by mouth 3 (three) times daily as needed., Disp: 90 tablet, Rfl: 0 Medication Side Effects: none  Family Medical/ Social  History: Changes? No  MENTAL HEALTH EXAM:  There were no vitals taken for this visit.There is no height or weight on file to calculate BMI.  General Appearance: unable to assess  Eye Contact:  unable to assess  Speech:  Clear and Coherent  Volume:  Normal  Mood:  Euthymic  Affect:  unable to  assess  Thought Process:  Goal Directed  Orientation:  Full (Time, Place, and Person)  Thought Content: Logical   Suicidal Thoughts:  No  Homicidal Thoughts:  No  Memory:  WNL  Judgement:  Good  Insight:  Good  Psychomotor Activity:  unable to assess  Concentration:  Concentration: Good  Recall:  Good  Fund of Knowledge: Good  Language: Good  Assets:  Desire for Improvement  ADL's:  Intact  Cognition: WNL  Prognosis:  Good    DIAGNOSES:    ICD-10-CM   1. Attention deficit hyperactivity disorder (ADHD), predominantly inattentive type F90.0 amphetamine-dextroamphetamine (ADDERALL) 10 MG tablet  2. Anxiety state F41.1     Receiving Psychotherapy: No    RECOMMENDATIONS:  Continue hydroxyzine 25 mg, 1/2-1 p.o. every 8 hours as needed anxiety or sleep.  Encouraged him to try it during the day if needed. Continue Adderall 10 mg 1 3 times daily.  PDMP was reviewed. Continue Xanax 0.5 mg daily as needed for emergencies. Return in 3 months.  Melony Overly, PA-C   This record has been created using [employer].  Chart creation errors have been sought, but may not always have been located and corrected. Such creation errors do not reflect on the standard of medical care.

## 2019-01-27 ENCOUNTER — Other Ambulatory Visit: Payer: Self-pay | Admitting: Internal Medicine

## 2019-01-27 DIAGNOSIS — R52 Pain, unspecified: Secondary | ICD-10-CM

## 2019-01-27 DIAGNOSIS — F411 Generalized anxiety disorder: Secondary | ICD-10-CM

## 2019-01-28 MED ORDER — HYDROCODONE-ACETAMINOPHEN 5-325 MG PO TABS: ORAL_TABLET | ORAL | 0 refills | Status: DC

## 2019-03-08 ENCOUNTER — Other Ambulatory Visit: Payer: Self-pay

## 2019-03-08 ENCOUNTER — Ambulatory Visit (INDEPENDENT_AMBULATORY_CARE_PROVIDER_SITE_OTHER): Payer: BC Managed Care – PPO | Admitting: Internal Medicine

## 2019-03-08 DIAGNOSIS — F900 Attention-deficit hyperactivity disorder, predominantly inattentive type: Secondary | ICD-10-CM | POA: Diagnosis not present

## 2019-03-08 DIAGNOSIS — R7302 Impaired glucose tolerance (oral): Secondary | ICD-10-CM | POA: Diagnosis not present

## 2019-03-08 DIAGNOSIS — I10 Essential (primary) hypertension: Secondary | ICD-10-CM

## 2019-03-08 DIAGNOSIS — M25512 Pain in left shoulder: Secondary | ICD-10-CM

## 2019-03-08 DIAGNOSIS — F411 Generalized anxiety disorder: Secondary | ICD-10-CM | POA: Diagnosis not present

## 2019-03-08 DIAGNOSIS — R52 Pain, unspecified: Secondary | ICD-10-CM

## 2019-03-08 MED ORDER — HYDROCODONE-ACETAMINOPHEN 5-325 MG PO TABS: ORAL_TABLET | ORAL | 0 refills | Status: DC

## 2019-03-08 NOTE — Addendum Note (Signed)
Addended by: Westley Hummer B on: 03/08/2019 07:56 AM   Modules accepted: Orders

## 2019-03-08 NOTE — Progress Notes (Signed)
Virtual Visit via Video Note  I connected with Shaun Barron on 03/08/19 at  7:00 AM EDT by a video enabled telemedicine application and verified that I am speaking with the correct person using two identifiers.  Location patient: home Location provider: work office Persons participating in the virtual visit: patient, provider  I discussed the limitations of evaluation and management by telemedicine and the availability of in person appointments. The patient expressed understanding and agreed to proceed.   HPI: This is a scheduled visit for medication refills and also to discuss chronic conditions.  He is due for annual physical and will schedule in 3 months.  Since I last saw him he has seen psychiatry.  His ADHD has been stable on 10 mg 3 times daily of Adderall, psychiatry has agreed to fill for now.  He has also been started on hydroxyzine for his anxiety and has been told to use Xanax only sparingly (psychiatry will also fill Xanax).  Continues to have chronic shoulder pain and we had previously agreed to 15 tablets of hydrocodone per month, he states that is usually 45 days before he feels this.  He has no acute complaints today and feels overall well.  He states his been checking his blood pressure at home, he is only on hydrochlorothiazide 25 mg.  His blood pressures are around 115-120/68-70 on average.   ROS: Constitutional: Denies fever, chills, diaphoresis, appetite change and fatigue.  HEENT: Denies photophobia, eye pain, redness, hearing loss, ear pain, congestion, sore throat, rhinorrhea, sneezing, mouth sores, trouble swallowing, neck pain, neck stiffness and tinnitus.   Respiratory: Denies SOB, DOE, cough, chest tightness,  and wheezing.   Cardiovascular: Denies chest pain, palpitations and leg swelling.  Gastrointestinal: Denies nausea, vomiting, abdominal pain, diarrhea, constipation, blood in stool and abdominal distention.  Genitourinary: Denies dysuria, urgency,  frequency, hematuria, flank pain and difficulty urinating.  Endocrine: Denies: hot or cold intolerance, sweats, changes in hair or nails, polyuria, polydipsia. Musculoskeletal: Denies myalgias, back pain, joint swelling, arthralgias and gait problem.  Skin: Denies pallor, rash and wound.  Neurological: Denies dizziness, seizures, syncope, weakness, light-headedness, numbness and headaches.  Hematological: Denies adenopathy. Easy bruising, personal or family bleeding history  Psychiatric/Behavioral: Denies suicidal ideation, mood changes, confusion, nervousness, sleep disturbance and agitation   Past Medical History:  Diagnosis Date  . ADHD (attention deficit hyperactivity disorder)   . ALLERGIC RHINITIS 10/09/2008  . ANXIETY 10/09/2008  . GYNECOMASTIA 04/16/2010  . SHOULDER PAIN, LEFT, CHRONIC 10/19/2008    Past Surgical History:  Procedure Laterality Date  . LASIK      Family History  Problem Relation Age of Onset  . Coronary artery disease Father        bilat carotid artery stenosis:s/p CABG  . Cancer Mother        hx multiple myeloma-status post stem transpantation July 2008 and presently in remission  . Coronary artery disease Maternal Grandfather   . Breast cancer Maternal Grandmother   . Leukemia Paternal Grandmother     SOCIAL HX:   reports that he quit smoking about 13 years ago. He has never used smokeless tobacco. He reports current alcohol use of about 1.0 standard drinks of alcohol per week. He reports that he does not use drugs.   Current Outpatient Medications:  .  ALPRAZolam (XANAX) 0.5 MG tablet, Take 1 tablet (0.5 mg total) by mouth at bedtime as needed for anxiety., Disp: 15 tablet, Rfl: 0 .  amphetamine-dextroamphetamine (ADDERALL) 10 MG tablet, Take  1 tablet (10 mg total) by mouth 3 (three) times daily as needed., Disp: 90 tablet, Rfl: 0 .  [START ON 03/19/2019] amphetamine-dextroamphetamine (ADDERALL) 10 MG tablet, Take 1 tablet (10 mg total) by mouth 3  (three) times daily as needed., Disp: 90 tablet, Rfl: 0 .  amphetamine-dextroamphetamine (ADDERALL) 10 MG tablet, Take 1 tablet (10 mg total) by mouth 3 (three) times daily as needed., Disp: 90 tablet, Rfl: 0 .  amphetamine-dextroamphetamine (ADDERALL) 10 MG tablet, Take 1 tablet (10 mg total) by mouth 3 (three) times daily., Disp: 90 tablet, Rfl: 0 .  hydrochlorothiazide (HYDRODIURIL) 25 MG tablet, Take 1 tablet (25 mg total) by mouth daily., Disp: 90 tablet, Rfl: 3 .  HYDROcodone-acetaminophen (NORCO/VICODIN) 5-325 MG tablet, TAKE ONE TABLET BY MOUTH EVERY 6 HOURS AS NEEDED FOR PAIN, Disp: 15 tablet, Rfl: 0 .  hydrOXYzine (ATARAX/VISTARIL) 25 MG tablet, Take 0.5-1 tablets (12.5-25 mg total) by mouth every 8 (eight) hours as needed., Disp: 60 tablet, Rfl: 0  EXAM:   VITALS per patient if applicable: Blood pressure 115/68 today  GENERAL: alert, oriented, appears well and in no acute distress  HEENT: atraumatic, conjunttiva clear, no obvious abnormalities on inspection of external nose and ears  NECK: normal movements of the head and neck  LUNGS: on inspection no signs of respiratory distress, breathing rate appears normal, no obvious gross increased work of breathing, gasping or wheezing  CV: no obvious cyanosis  MS: moves all visible extremities without noticeable abnormality  PSYCH/NEURO: pleasant and cooperative, no obvious depression or anxiety, speech and thought processing grossly intact  ASSESSMENT AND PLAN:   Anxiety state  -Followed by psychiatry, on hydroxyzine and PRN Xanax.  Attention deficit hyperactivity disorder (ADHD), predominantly inattentive type  -On Adderall now prescribed by psychiatry.  IGT (impaired glucose tolerance)  -Last A1c was 5 in November 2019, will redo when he returns for physical in 3 months  Essential hypertension  -Well-controlled per patient report, continue hydrochlorothiazide.  Pain in joint of left shoulder  NCCSRS reviewed in EPIC    Indication for chronic opioid:  Chronic shoulder pain Medication and dose:  Hydrocodone/acetaminophen 5/325 mg 1 tablet daily as needed # pills per month:  Limited 15 tablets Last UDS date:  None on file Opioid Treatment Agreement signed:  Yes Opioid Treatment Agreement last reviewed with patient:  November 2019 [place] reviewed this encounter (include red flags):   Yes, no red flags, overdose risk score 160.      I discussed the assessment and treatment plan with the patient. The patient was provided an opportunity to ask questions and all were answered. The patient agreed with the plan and demonstrated an understanding of the instructions.   The patient was advised to call back or seek an in-person evaluation if the symptoms worsen or if the condition fails to improve as anticipated.    Lelon Frohlich, MD  [facility] at [facility]

## 2019-03-22 ENCOUNTER — Telehealth: Payer: Self-pay | Admitting: Physician Assistant

## 2019-03-22 ENCOUNTER — Other Ambulatory Visit: Payer: Self-pay

## 2019-03-22 DIAGNOSIS — F900 Attention-deficit hyperactivity disorder, predominantly inattentive type: Secondary | ICD-10-CM

## 2019-03-22 MED ORDER — AMPHETAMINE-DEXTROAMPHETAMINE 10 MG PO TABS: 10.0000 mg | ORAL_TABLET | Freq: Three times a day (TID) | ORAL | 0 refills | Status: DC | PRN

## 2019-03-22 NOTE — Telephone Encounter (Signed)
Pt requesting a refill on his Adderall 10mg  .Fill at the [facility] on [address] in [place]. Pt scheduled a appt for 9/2.

## 2019-03-22 NOTE — Telephone Encounter (Signed)
Pended for approval Last fill 02/18/2019 #90

## 2019-04-22 ENCOUNTER — Other Ambulatory Visit: Payer: Self-pay

## 2019-04-22 ENCOUNTER — Telehealth: Payer: Self-pay | Admitting: Physician Assistant

## 2019-04-22 DIAGNOSIS — F900 Attention-deficit hyperactivity disorder, predominantly inattentive type: Secondary | ICD-10-CM

## 2019-04-22 MED ORDER — AMPHETAMINE-DEXTROAMPHETAMINE 10 MG PO TABS: 10.0000 mg | ORAL_TABLET | Freq: Three times a day (TID) | ORAL | 0 refills | Status: DC | PRN

## 2019-04-22 NOTE — Telephone Encounter (Signed)
Pended for approval.

## 2019-04-22 NOTE — Telephone Encounter (Signed)
Patient need refill on Adderall to be sent to [facility] on [address] in [place]

## 2019-04-27 ENCOUNTER — Encounter: Payer: Self-pay | Admitting: Physician Assistant

## 2019-04-27 ENCOUNTER — Ambulatory Visit (INDEPENDENT_AMBULATORY_CARE_PROVIDER_SITE_OTHER): Payer: BC Managed Care – PPO | Admitting: Physician Assistant

## 2019-04-27 ENCOUNTER — Other Ambulatory Visit: Payer: Self-pay

## 2019-04-27 DIAGNOSIS — F411 Generalized anxiety disorder: Secondary | ICD-10-CM | POA: Diagnosis not present

## 2019-04-27 DIAGNOSIS — F900 Attention-deficit hyperactivity disorder, predominantly inattentive type: Secondary | ICD-10-CM | POA: Diagnosis not present

## 2019-04-27 MED ORDER — HYDROXYZINE HCL 25 MG PO TABS: 12.5000 mg | ORAL_TABLET | Freq: Three times a day (TID) | ORAL | 2 refills | Status: DC | PRN

## 2019-04-27 MED ORDER — ALPRAZOLAM 0.5 MG PO TABS: 0.5000 mg | ORAL_TABLET | Freq: Every evening | ORAL | 0 refills | Status: DC | PRN

## 2019-04-27 NOTE — Progress Notes (Signed)
[facility]  Patient ID: Shaun Barron,  MRN: [id number]  PCP: Philip AspenHernandez Acosta, Limmie PatriciaEstela Y, MD  Date of Evaluation: 04/27/2019 Time spent:15 minutes  Chief Complaint:  Chief Complaint    ADD; Anxiety; Follow-up     Virtual Visit via Telephone Note  I connected with patient by a video enabled telemedicine application or telephone, with their informed consent, and verified patient privacy and that I am speaking with the correct person using two identifiers.  I am private, in my home and the patient is at work.  I discussed the limitations, risks, security and privacy concerns of performing an evaluation and management service by telephone and the availability of in person appointments. I also discussed with the patient that there may be a patient responsible charge related to this service. The patient expressed understanding and agreed to proceed.   I discussed the assessment and treatment plan with the patient. The patient was provided an opportunity to ask questions and all were answered. The patient agreed with the plan and demonstrated an understanding of the instructions.   The patient was advised to call back or seek an in-person evaluation if the symptoms worsen or if the condition fails to improve as anticipated.  I provided 15 minutes of non-face-to-face time during this encounter.  HISTORY/CURRENT STATUS: HPI for 5472-month med check.  Patient states he is doing well.  The Adderall is helping a lot with his focus and being able to finish tasks.  The current dose seems to be working well.  He is having no side effects from it.  Denies anhedonia, states energy and motivation are good.  No isolating.  Work is going well.  Sometimes he has to take the hydroxyzine to help with sleep but it is effective.  Denies suicidal or homicidal thoughts.  He does have anxiety on occasion.  He is using the Xanax once every few weeks on average.  It is still effective when  needed.  Denies dizziness, syncope, seizures, numbness, tingling, tremor, tics, unsteady gait, slurred speech, confusion. Denies muscle or joint pain, stiffness, or dystonia.  Individual Medical History/ Review of Systems: Changes? :No    Past medications for mental health diagnoses include: Ritalin, Valium, Xanax, Klonopin, Prozac, Zoloft  Allergies: Patient has no known allergies.  Current Medications:  Current Outpatient Medications:  .  ALPRAZolam (XANAX) 0.5 MG tablet, Take 1 tablet (0.5 mg total) by mouth at bedtime as needed for anxiety., Disp: 15 tablet, Rfl: 0 .  amphetamine-dextroamphetamine (ADDERALL) 10 MG tablet, Take 1 tablet (10 mg total) by mouth 3 (three) times daily as needed., Disp: 90 tablet, Rfl: 0 .  hydrochlorothiazide (HYDRODIURIL) 25 MG tablet, Take 1 tablet (25 mg total) by mouth daily., Disp: 90 tablet, Rfl: 3 .  HYDROcodone-acetaminophen (NORCO/VICODIN) 5-325 MG tablet, TAKE ONE TABLET BY MOUTH EVERY 6 HOURS AS NEEDED FOR PAIN, Disp: 15 tablet, Rfl: 0 .  hydrOXYzine (ATARAX/VISTARIL) 25 MG tablet, Take 0.5-1 tablets (12.5-25 mg total) by mouth every 8 (eight) hours as needed., Disp: 60 tablet, Rfl: 2 Medication Side Effects: none  Family Medical/ Social History: Changes? No  MENTAL HEALTH EXAM:  There were no vitals taken for this visit.There is no height or weight on file to calculate BMI.  General Appearance: Unable to assess  Eye Contact:  Unable to assess  Speech:  Clear and Coherent  Volume:  Normal  Mood:  Euthymic  Affect:  Unable to assess  Thought Process:  Goal Directed  Orientation:  Full (Time,  Place, and Person)  Thought Content: Logical   Suicidal Thoughts:  No  Homicidal Thoughts:  No  Memory:  WNL  Judgement:  Good  Insight:  Good  Psychomotor Activity:  Unable to assess  Concentration:  Concentration: Good and Attention Span: Good  Recall:  Good  Fund of Knowledge: Good  Language: Good  Assets:  Desire for Improvement  ADL's:   Intact  Cognition: WNL  Prognosis:  Good    DIAGNOSES:    ICD-10-CM   1. Anxiety state  F41.1 ALPRAZolam (XANAX) 0.5 MG tablet  2. Attention deficit hyperactivity disorder (ADHD), predominantly inattentive type  F90.0     Receiving Psychotherapy: No    RECOMMENDATIONS:  PDMP was reviewed. Continue Adderall 10 mg 1 3 times daily.  When time to send in new prescriptions, he prefers we send in 1 month at a time because he has had problems with his pharmacy when we send in 3 separate prescriptions at a time. Continue Xanax 0.5 mg 1 daily as needed rare occasions. Continue hydroxyzine 25 mg, 1/2-1 p.o. every 8 hours as needed anxiety. Return in 6 months.  Donnal Moat, PA-C   This record has been created using [employer].  Chart creation errors have been sought, but may not always have been located and corrected. Such creation errors do not reflect on the standard of medical care.

## 2019-05-13 ENCOUNTER — Other Ambulatory Visit: Payer: Self-pay | Admitting: Internal Medicine

## 2019-05-13 DIAGNOSIS — R52 Pain, unspecified: Secondary | ICD-10-CM

## 2019-05-13 DIAGNOSIS — F411 Generalized anxiety disorder: Secondary | ICD-10-CM

## 2019-05-13 NOTE — Telephone Encounter (Signed)
Please see rx refill request  °

## 2019-05-23 ENCOUNTER — Telehealth: Payer: Self-pay | Admitting: Physician Assistant

## 2019-05-23 ENCOUNTER — Other Ambulatory Visit: Payer: Self-pay

## 2019-05-23 DIAGNOSIS — F900 Attention-deficit hyperactivity disorder, predominantly inattentive type: Secondary | ICD-10-CM

## 2019-05-23 MED ORDER — AMPHETAMINE-DEXTROAMPHETAMINE 10 MG PO TABS: 10.0000 mg | ORAL_TABLET | Freq: Three times a day (TID) | ORAL | 0 refills | Status: DC | PRN

## 2019-05-23 NOTE — Telephone Encounter (Signed)
Pended for approval, last visit 04/27/2019

## 2019-05-23 NOTE — Telephone Encounter (Signed)
Pt call to request refill on Adderall @ [facility] [place]

## 2019-06-21 ENCOUNTER — Other Ambulatory Visit: Payer: Self-pay

## 2019-06-21 ENCOUNTER — Telehealth: Payer: Self-pay | Admitting: Physician Assistant

## 2019-06-21 DIAGNOSIS — F900 Attention-deficit hyperactivity disorder, predominantly inattentive type: Secondary | ICD-10-CM

## 2019-06-21 MED ORDER — AMPHETAMINE-DEXTROAMPHETAMINE 10 MG PO TABS: 10.0000 mg | ORAL_TABLET | Freq: Three times a day (TID) | ORAL | 0 refills | Status: DC | PRN

## 2019-06-21 NOTE — Telephone Encounter (Signed)
Pt requesting a refill on his Adderall. Fill at the [facility] on [address] [place]. Scheduled appt for 11/24.

## 2019-06-21 NOTE — Telephone Encounter (Signed)
Last refill 05/23/2019 Pended for approval

## 2019-07-19 ENCOUNTER — Ambulatory Visit: Payer: BC Managed Care – PPO | Admitting: Physician Assistant

## 2019-07-25 ENCOUNTER — Telehealth: Payer: Self-pay | Admitting: Physician Assistant

## 2019-07-25 ENCOUNTER — Other Ambulatory Visit: Payer: Self-pay

## 2019-07-25 DIAGNOSIS — F900 Attention-deficit hyperactivity disorder, predominantly inattentive type: Secondary | ICD-10-CM

## 2019-07-25 NOTE — Telephone Encounter (Signed)
Pt call to request refill for Adderall @ [facility] [place]

## 2019-07-25 NOTE — Telephone Encounter (Signed)
Pended for approval Last apt in Sept. 2020

## 2019-07-26 MED ORDER — AMPHETAMINE-DEXTROAMPHETAMINE 10 MG PO TABS: 10.0000 mg | ORAL_TABLET | Freq: Three times a day (TID) | ORAL | 0 refills | Status: DC | PRN

## 2019-07-30 ENCOUNTER — Other Ambulatory Visit: Payer: Self-pay | Admitting: Internal Medicine

## 2019-07-30 DIAGNOSIS — I10 Essential (primary) hypertension: Secondary | ICD-10-CM

## 2019-08-14 DIAGNOSIS — Z20828 Contact with and (suspected) exposure to other viral communicable diseases: Secondary | ICD-10-CM | POA: Diagnosis not present

## 2019-08-14 DIAGNOSIS — R0981 Nasal congestion: Secondary | ICD-10-CM | POA: Diagnosis not present

## 2019-08-14 DIAGNOSIS — R430 Anosmia: Secondary | ICD-10-CM | POA: Diagnosis not present

## 2019-08-22 ENCOUNTER — Telehealth: Payer: Self-pay | Admitting: Physician Assistant

## 2019-08-22 NOTE — Telephone Encounter (Signed)
Pt would like a refill on his Adderall. Please send to [facility] on [address] in [place].

## 2019-08-23 ENCOUNTER — Other Ambulatory Visit: Payer: Self-pay

## 2019-08-23 DIAGNOSIS — F900 Attention-deficit hyperactivity disorder, predominantly inattentive type: Secondary | ICD-10-CM

## 2019-08-23 MED ORDER — AMPHETAMINE-DEXTROAMPHETAMINE 10 MG PO TABS: 10.0000 mg | ORAL_TABLET | Freq: Three times a day (TID) | ORAL | 0 refills | Status: DC | PRN

## 2019-08-23 NOTE — Telephone Encounter (Signed)
Last refill 07/26/2019 Pended for Dr. Clovis Pu to approval Last apt with Helene Kelp on 04/27/2019

## 2019-09-22 ENCOUNTER — Telehealth: Payer: Self-pay | Admitting: Physician Assistant

## 2019-09-22 ENCOUNTER — Other Ambulatory Visit: Payer: Self-pay

## 2019-09-22 DIAGNOSIS — F900 Attention-deficit hyperactivity disorder, predominantly inattentive type: Secondary | ICD-10-CM

## 2019-09-22 MED ORDER — AMPHETAMINE-DEXTROAMPHETAMINE 10 MG PO TABS: 10.0000 mg | ORAL_TABLET | Freq: Three times a day (TID) | ORAL | 0 refills | Status: DC | PRN

## 2019-09-22 NOTE — Telephone Encounter (Signed)
Last refill 08/24/2019, pended for Rosey Bath to submit Due back in March 2021

## 2019-09-22 NOTE — Telephone Encounter (Signed)
Pt requesting a refill on Adderall. Filll at the [facility] on [address]. Last appt 9/2.

## 2019-10-21 ENCOUNTER — Telehealth: Payer: Self-pay | Admitting: Physician Assistant

## 2019-10-21 ENCOUNTER — Other Ambulatory Visit: Payer: Self-pay | Admitting: Physician Assistant

## 2019-10-21 DIAGNOSIS — F900 Attention-deficit hyperactivity disorder, predominantly inattentive type: Secondary | ICD-10-CM

## 2019-10-21 MED ORDER — AMPHETAMINE-DEXTROAMPHETAMINE 10 MG PO TABS: 10.0000 mg | ORAL_TABLET | Freq: Three times a day (TID) | ORAL | 0 refills | Status: DC | PRN

## 2019-10-21 NOTE — Telephone Encounter (Signed)
Patient called and said that he needs a refill on his adderall 10 mg tab to the [facility] [address] in [place]. He has an appointment  On 3/12

## 2019-10-21 NOTE — Telephone Encounter (Signed)
Rx sent in

## 2019-11-04 ENCOUNTER — Ambulatory Visit (INDEPENDENT_AMBULATORY_CARE_PROVIDER_SITE_OTHER): Payer: Self-pay | Admitting: Physician Assistant

## 2019-11-04 ENCOUNTER — Other Ambulatory Visit: Payer: Self-pay

## 2019-11-04 ENCOUNTER — Encounter: Payer: Self-pay | Admitting: Physician Assistant

## 2019-11-04 DIAGNOSIS — F900 Attention-deficit hyperactivity disorder, predominantly inattentive type: Secondary | ICD-10-CM

## 2019-11-04 DIAGNOSIS — F411 Generalized anxiety disorder: Secondary | ICD-10-CM

## 2019-11-04 MED ORDER — AMPHETAMINE-DEXTROAMPHETAMINE 10 MG PO TABS: 10.0000 mg | ORAL_TABLET | Freq: Three times a day (TID) | ORAL | 0 refills | Status: DC

## 2019-11-04 MED ORDER — AMPHETAMINE-DEXTROAMPHETAMINE 10 MG PO TABS: 10.0000 mg | ORAL_TABLET | Freq: Three times a day (TID) | ORAL | 0 refills | Status: DC | PRN

## 2019-11-04 MED ORDER — ALPRAZOLAM 0.5 MG PO TABS: 0.5000 mg | ORAL_TABLET | Freq: Every evening | ORAL | 0 refills | Status: DC | PRN

## 2019-11-04 NOTE — Progress Notes (Signed)
[facility]  Patient ID: Shaun Barron,  MRN: [id number]  PCP: Isaac Bliss, Rayford Halsted, MD  Date of Evaluation: 11/04/2019 Time spent:20 minutes  Chief Complaint:  Chief Complaint    Anxiety; ADD      HISTORY/CURRENT STATUS: HPI For 6 month med check.  Doing really well as far as meds go. Sleeps good most of the time.  When he does not sleep well, he will use the hydroxyzine which is very helpful.  Usually he will have trouble falling asleep not staying asleep.  States the Adderall is still working well.  He is not having any side effects from it that he is aware of.  Due to the medication, he has able to focus and get things done in a timely manner without a lot of distractions.  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  It does get stressful at times "like with the new job."  He occasionally takes Xanax, maybe 1/week.  If he has a really rough day, he will take it when he gets home from work.   Able to enjoy things.  Energy and motivation are good.  Denies easy crying.  Appetite is normal.  Weight is stable.  Denies suicidal or homicidal thoughts.  Patient denies increased energy with decreased need for sleep, no increased talkativeness, no racing thoughts, no impulsivity or risky behaviors, no increased spending, no increased libido, no grandiosity.  Denies dizziness, syncope, seizures, numbness, tingling, tremor, tics, unsteady gait, slurred speech, confusion. Denies muscle or joint pain, stiffness, or dystonia.  Individual Medical History/ Review of Systems: Changes? :No    Past medications for mental health diagnoses include: Ritalin, Valium, Xanax, Klonopin, Prozac, Zoloft  Allergies: Patient has no known allergies.  Current Medications:  Current Outpatient Medications:  .  ALPRAZolam (XANAX) 0.5 MG tablet, Take 1 tablet (0.5 mg total) by mouth at bedtime as needed for anxiety., Disp: 30 tablet, Rfl: 0 .  [START ON 01/16/2020]  amphetamine-dextroamphetamine (ADDERALL) 10 MG tablet, Take 1 tablet (10 mg total) by mouth 3 (three) times daily as needed., Disp: 90 tablet, Rfl: 0 .  hydrochlorothiazide (HYDRODIURIL) 25 MG tablet, TAKE 1 TABLET(25 MG) BY MOUTH DAILY, Disp: 90 tablet, Rfl: 0 .  hydrOXYzine (ATARAX/VISTARIL) 25 MG tablet, Take 0.5-1 tablets (12.5-25 mg total) by mouth every 8 (eight) hours as needed., Disp: 60 tablet, Rfl: 2 .  Multiple Vitamin (MULTIVITAMIN) tablet, Take 1 tablet by mouth daily., Disp: , Rfl:  .  [START ON 12/18/2019] amphetamine-dextroamphetamine (ADDERALL) 10 MG tablet, Take 1 tablet (10 mg total) by mouth 3 (three) times daily., Disp: 90 tablet, Rfl: 0 .  [START ON 11/18/2019] amphetamine-dextroamphetamine (ADDERALL) 10 MG tablet, Take 1 tablet (10 mg total) by mouth 3 (three) times daily., Disp: 90 tablet, Rfl: 0 .  HYDROcodone-acetaminophen (NORCO/VICODIN) 5-325 MG tablet, TAKE ONE TABLET BY MOUTH EVERY 6 HOURS AS NEEDED FOR PAIN (Patient not taking: Reported on 11/04/2019), Disp: 15 tablet, Rfl: 0 Medication Side Effects: none  Family Medical/ Social History: Changes? No  MENTAL HEALTH EXAM:  Blood pressure (!) 152/85, pulse 88.There is no height or weight on file to calculate BMI.  General Appearance: Casual, Neat and Well Groomed  Eye Contact:  Good  Speech:  Clear and Coherent and Normal Rate  Volume:  Normal  Mood:  Euthymic  Affect:  Appropriate  Thought Process:  Goal Directed and Descriptions of Associations: Intact  Orientation:  Full (Time, Place, and Person)  Thought Content:  Logical   Suicidal Thoughts:  No  Homicidal Thoughts:  No  Memory:  WNL  Judgement:  Good  Insight:  Good  Psychomotor Activity:  Normal  Concentration:  Concentration: Good and Attention Span: Good  Recall:  Good  Fund of Knowledge: Good  Language: Good  Assets:  Desire for Improvement  ADL's:  Intact  Cognition: WNL  Prognosis:  Good    DIAGNOSES:    ICD-10-CM   1. Anxiety state  F41.1  ALPRAZolam (XANAX) 0.5 MG tablet  2. Attention deficit hyperactivity disorder (ADHD), predominantly inattentive type  F90.0 amphetamine-dextroamphetamine (ADDERALL) 10 MG tablet    Receiving Psychotherapy: No    RECOMMENDATIONS:  PDMP was reviewed. I spent 20 minutes with him. He has known white coat hypertension.  I have asked him to check his BPs at home a couple of times a week and keep a diary of that.  Take it with him to his next follow-up for his blood pressure with his PCP.  The BP is only slightly elevated today but I do not want to run at that level all the time without his PCP knowing it.  He verbalizes understanding. Continue Adderall 10 mg, 1 p.o. 3 times daily. Continue Xanax 0.5 mg, 1/2-1 daily as needed anxiety.  Take as sparingly as possible. Continue hydroxyzine 25 mg, 1/2-1 every 8 hours as needed anxiety or sleep. Return in 6 months.  Melony Overly, PA-C

## 2019-11-05 ENCOUNTER — Other Ambulatory Visit: Payer: Self-pay | Admitting: Internal Medicine

## 2019-11-05 DIAGNOSIS — I10 Essential (primary) hypertension: Secondary | ICD-10-CM

## 2020-01-17 ENCOUNTER — Other Ambulatory Visit: Payer: Self-pay

## 2020-01-17 ENCOUNTER — Telehealth: Payer: Self-pay | Admitting: Physician Assistant

## 2020-01-17 DIAGNOSIS — F411 Generalized anxiety disorder: Secondary | ICD-10-CM

## 2020-01-17 MED ORDER — HYDROXYZINE HCL 25 MG PO TABS: 12.5000 mg | ORAL_TABLET | Freq: Three times a day (TID) | ORAL | 2 refills | Status: DC | PRN

## 2020-01-17 MED ORDER — ALPRAZOLAM 0.5 MG PO TABS: 0.5000 mg | ORAL_TABLET | Freq: Every evening | ORAL | 0 refills | Status: DC | PRN

## 2020-01-17 NOTE — Telephone Encounter (Signed)
Requesting refill on Adderall, Xanax and Hydroxyzine. Please send to [facility] [place]. [address]

## 2020-01-17 NOTE — Telephone Encounter (Signed)
Rx for Adderall submitted yesterday to pharmacy  Pended other Rx's for Shaun Barron to submit Last refill Xanax 11/06/2019

## 2020-02-20 ENCOUNTER — Telehealth: Payer: Self-pay | Admitting: Physician Assistant

## 2020-02-20 ENCOUNTER — Other Ambulatory Visit: Payer: Self-pay

## 2020-02-20 DIAGNOSIS — F900 Attention-deficit hyperactivity disorder, predominantly inattentive type: Secondary | ICD-10-CM

## 2020-02-20 MED ORDER — AMPHETAMINE-DEXTROAMPHETAMINE 10 MG PO TABS: 10.0000 mg | ORAL_TABLET | Freq: Three times a day (TID) | ORAL | 0 refills | Status: DC | PRN

## 2020-02-20 MED ORDER — AMPHETAMINE-DEXTROAMPHETAMINE 10 MG PO TABS: 10.0000 mg | ORAL_TABLET | Freq: Three times a day (TID) | ORAL | 0 refills | Status: DC

## 2020-02-20 NOTE — Telephone Encounter (Signed)
Last refill 01/20/2020, pended 3 Rx's for Rosey Bath to review and send. Next apt 05/11/20

## 2020-02-20 NOTE — Telephone Encounter (Signed)
Pt requesting refill for Adderall. Pt has moved and wants all refills to go to [facility].  Apt  9/17

## 2020-04-09 ENCOUNTER — Other Ambulatory Visit: Payer: Self-pay | Admitting: Internal Medicine

## 2020-04-09 DIAGNOSIS — I10 Essential (primary) hypertension: Secondary | ICD-10-CM

## 2020-05-09 ENCOUNTER — Other Ambulatory Visit: Payer: Self-pay | Admitting: Internal Medicine

## 2020-05-09 DIAGNOSIS — I10 Essential (primary) hypertension: Secondary | ICD-10-CM

## 2020-05-11 ENCOUNTER — Encounter: Payer: Self-pay | Admitting: Physician Assistant

## 2020-05-11 ENCOUNTER — Ambulatory Visit (INDEPENDENT_AMBULATORY_CARE_PROVIDER_SITE_OTHER): Payer: BC Managed Care – PPO | Admitting: Physician Assistant

## 2020-05-11 ENCOUNTER — Other Ambulatory Visit: Payer: Self-pay

## 2020-05-11 VITALS — BP 129/82 | HR 91

## 2020-05-11 DIAGNOSIS — F900 Attention-deficit hyperactivity disorder, predominantly inattentive type: Secondary | ICD-10-CM

## 2020-05-11 DIAGNOSIS — F411 Generalized anxiety disorder: Secondary | ICD-10-CM | POA: Diagnosis not present

## 2020-05-11 MED ORDER — AMPHETAMINE-DEXTROAMPHETAMINE 15 MG PO TABS
15.0000 mg | ORAL_TABLET | Freq: Three times a day (TID) | ORAL | 0 refills | Status: DC
Start: 2020-05-19 — End: 2020-08-21

## 2020-05-11 MED ORDER — ALPRAZOLAM 0.5 MG PO TABS: 0.5000 mg | ORAL_TABLET | Freq: Two times a day (BID) | ORAL | 0 refills | Status: DC | PRN

## 2020-05-11 MED ORDER — AMPHETAMINE-DEXTROAMPHETAMINE 15 MG PO TABS
15.0000 mg | ORAL_TABLET | Freq: Three times a day (TID) | ORAL | 0 refills | Status: DC
Start: 2020-07-17 — End: 2020-08-21

## 2020-05-11 MED ORDER — AMPHETAMINE-DEXTROAMPHETAMINE 15 MG PO TABS: 15.0000 mg | ORAL_TABLET | Freq: Three times a day (TID) | ORAL | 0 refills | Status: DC

## 2020-05-11 NOTE — Progress Notes (Signed)
[facility]  Patient ID: Shaun Barron,  MRN: [id number]  PCP: Philip Aspen, Limmie Patricia, MD  Date of Evaluation: 05/11/2020 Time spent:20 minutes  Chief Complaint:  Chief Complaint    Follow-up; Anxiety; ADHD      HISTORY/CURRENT STATUS: HPI For 6 month med check.  Feels like the Adderall isn't working as well as it used to.  Doesn't feel strong enough. Still has trouble with being distracted easily. Harder time getting things done now.  Worse in past few months.  Able to enjoy things.  Energy and motivation are good.  Denies easy crying.  Appetite is normal.  Weight is stable.  Work is going well. Sleeps pretty well. Anxiety is a lot better. Not using the Xanax very often, maybe twice per week, if that. Denies suicidal or homicidal thoughts.  Patient denies increased energy with decreased need for sleep, no increased talkativeness, no racing thoughts, no impulsivity or risky behaviors, no increased spending, no increased libido, no grandiosity.  Denies dizziness, syncope, seizures, numbness, tingling, tremor, tics, unsteady gait, slurred speech, confusion. Denies muscle or joint pain, stiffness, or dystonia.  Individual Medical History/ Review of Systems: Changes? :No    Past medications for mental health diagnoses include: Ritalin, Valium, Xanax, Klonopin, Prozac, Zoloft  Allergies: Patient has no known allergies.  Current Medications:  Current Outpatient Medications:  .  ALPRAZolam (XANAX) 0.5 MG tablet, Take 1 tablet (0.5 mg total) by mouth 2 (two) times daily as needed for anxiety., Disp: 30 tablet, Rfl: 0 .  hydrochlorothiazide (HYDRODIURIL) 25 MG tablet, TAKE 1 TABLET(25 MG) BY MOUTH DAILY, Disp: 90 tablet, Rfl: 1 .  hydrOXYzine (ATARAX/VISTARIL) 25 MG tablet, Take 0.5-1 tablets (12.5-25 mg total) by mouth every 8 (eight) hours as needed., Disp: 60 tablet, Rfl: 2 .  lisinopril (ZESTRIL) 5 MG tablet, TAKE 1 TABLET(5 MG) BY MOUTH DAILY, Disp: 30 tablet,  Rfl: 0 .  Multiple Vitamin (MULTIVITAMIN) tablet, Take 1 tablet by mouth daily., Disp: , Rfl:  .  [START ON 06/17/2020] amphetamine-dextroamphetamine (ADDERALL) 15 MG tablet, Take 1 tablet by mouth 3 (three) times daily., Disp: 90 tablet, Rfl: 0 .  [START ON 05/19/2020] amphetamine-dextroamphetamine (ADDERALL) 15 MG tablet, Take 1 tablet by mouth 3 (three) times daily., Disp: 90 tablet, Rfl: 0 .  [START ON 07/17/2020] amphetamine-dextroamphetamine (ADDERALL) 15 MG tablet, Take 1 tablet by mouth 3 (three) times daily., Disp: 90 tablet, Rfl: 0 Medication Side Effects: none  Family Medical/ Social History: Changes? No  MENTAL HEALTH EXAM:  Blood pressure 129/82, pulse 91.There is no height or weight on file to calculate BMI.  General Appearance: Casual, Neat and Well Groomed  Eye Contact:  Good  Speech:  Clear and Coherent and Normal Rate  Volume:  Normal  Mood:  Euthymic  Affect:  Appropriate  Thought Process:  Goal Directed and Descriptions of Associations: Intact  Orientation:  Full (Time, Place, and Person)  Thought Content: Logical   Suicidal Thoughts:  No  Homicidal Thoughts:  No  Memory:  WNL  Judgement:  Good  Insight:  Good  Psychomotor Activity:  Normal  Concentration:  Concentration: Fair and Attention Span: Fair  Recall:  Good  Fund of Knowledge: Good  Language: Good  Assets:  Desire for Improvement  ADL's:  Intact  Cognition: WNL  Prognosis:  Good    DIAGNOSES:    ICD-10-CM   1. Attention deficit hyperactivity disorder (ADHD), predominantly inattentive type  F90.0   2. Anxiety state  F41.1 ALPRAZolam (XANAX) 0.5 MG  tablet    Receiving Psychotherapy: No    RECOMMENDATIONS:  PDMP was reviewed. I prvided 20 mins of face to face time during this encounter.  Recommend increasing the Adderall dose. He'll call in about 3 weeks if this dose isnt helpful, and appropriate changes will be made. Increase Adderall to 15 mg, 1 po tid (breakfast, lunch, and, late afternoon  prn. Continue Xanax 0.5 mg, 1/2-1 daily as needed anxiety.  Take as sparingly as possible. Continue hydroxyzine 25 mg, 1/2-1 every 8 hours as needed anxiety or sleep. Return in 6 months.  Melony Overly, PA-C

## 2020-05-21 DIAGNOSIS — Z1159 Encounter for screening for other viral diseases: Secondary | ICD-10-CM | POA: Diagnosis not present

## 2020-05-21 DIAGNOSIS — I10 Essential (primary) hypertension: Secondary | ICD-10-CM | POA: Diagnosis not present

## 2020-06-21 DIAGNOSIS — I10 Essential (primary) hypertension: Secondary | ICD-10-CM | POA: Diagnosis not present

## 2020-06-21 DIAGNOSIS — E871 Hypo-osmolality and hyponatremia: Secondary | ICD-10-CM | POA: Diagnosis not present

## 2020-06-22 DIAGNOSIS — I10 Essential (primary) hypertension: Secondary | ICD-10-CM | POA: Diagnosis not present

## 2020-07-27 DIAGNOSIS — I10 Essential (primary) hypertension: Secondary | ICD-10-CM | POA: Diagnosis not present

## 2020-08-21 ENCOUNTER — Other Ambulatory Visit: Payer: Self-pay | Admitting: Physician Assistant

## 2020-08-21 ENCOUNTER — Telehealth: Payer: Self-pay | Admitting: Physician Assistant

## 2020-08-21 MED ORDER — AMPHETAMINE-DEXTROAMPHETAMINE 15 MG PO TABS: 15.0000 mg | ORAL_TABLET | Freq: Three times a day (TID) | ORAL | 0 refills | Status: DC

## 2020-08-21 NOTE — Telephone Encounter (Signed)
Prescription was sent to the [facility]

## 2020-08-21 NOTE — Telephone Encounter (Signed)
Pt left a message that he wants the adderall sent to [facility] in [place]

## 2020-08-21 NOTE — Telephone Encounter (Signed)
Pt called and needs a refill on his adderall 15 mg to be sent to the [facility] on [address]

## 2020-10-31 ENCOUNTER — Ambulatory Visit: Payer: BC Managed Care – PPO | Admitting: Physician Assistant

## 2020-11-19 ENCOUNTER — Telehealth: Payer: Self-pay | Admitting: Physician Assistant

## 2020-11-19 NOTE — Telephone Encounter (Signed)
Pt would like a refill on Adderall. Please send to [facility] on [address] in [place], [place].

## 2020-11-20 ENCOUNTER — Other Ambulatory Visit: Payer: Self-pay

## 2020-11-20 MED ORDER — AMPHETAMINE-DEXTROAMPHETAMINE 15 MG PO TABS: 15.0000 mg | ORAL_TABLET | Freq: Three times a day (TID) | ORAL | 0 refills | Status: DC

## 2020-11-20 NOTE — Telephone Encounter (Signed)
Pended 2 Rx's to be reviewed and sent by Rosey Bath Last refill 10/20/20

## 2020-11-26 ENCOUNTER — Telehealth: Payer: Self-pay

## 2020-11-26 NOTE — Telephone Encounter (Signed)
Prior Authorization submitted with [employer] of [place] for AMPHETAMINE-DEXTROAMPHETAMINE 15 MG #90/30 DAY, determination is DENIED stating the dose is higher than the FDA recommended dose, they also report the notes sent in didn't give adequate information to support requested amount.    Will discuss with Rosey Bath on next step.

## 2020-11-27 NOTE — Telephone Encounter (Signed)
Just FYI, they will cover 2/day of Adderall just not 3. Not sure that changes what I tell him.

## 2020-11-27 NOTE — Telephone Encounter (Signed)
Shaun Barron, is it out of the question for him to pay out of pocket?  On [facility] 90 pills at [facility] is $25.24.  Is this one of the situations where an insurance company decides they do not want to pay or do you know? May need to change to [facility], should be cheaper with co-pay card, but not sure. Would prefer he stay on Adderall

## 2020-11-27 NOTE — Telephone Encounter (Signed)
Lets increase to 20 mg bid.  It's 5 mg less than what he's taking now, but I don't think he'll notice much of a difference. And he can split it take 10 mg qid if that's more helpful

## 2020-11-28 NOTE — Telephone Encounter (Signed)
Pt called back and no need for anything because he uses [facility], he told the pharmacy not to run insurance  but they sent the PA anyway.

## 2020-11-28 NOTE — Telephone Encounter (Signed)
LM to call back.

## 2020-11-29 NOTE — Telephone Encounter (Signed)
Noted  

## 2020-12-27 ENCOUNTER — Ambulatory Visit (INDEPENDENT_AMBULATORY_CARE_PROVIDER_SITE_OTHER): Payer: BC Managed Care – PPO | Admitting: Physician Assistant

## 2020-12-27 ENCOUNTER — Encounter: Payer: Self-pay | Admitting: Physician Assistant

## 2020-12-27 ENCOUNTER — Other Ambulatory Visit: Payer: Self-pay

## 2020-12-27 VITALS — BP 142/83 | HR 111

## 2020-12-27 DIAGNOSIS — F411 Generalized anxiety disorder: Secondary | ICD-10-CM

## 2020-12-27 DIAGNOSIS — F900 Attention-deficit hyperactivity disorder, predominantly inattentive type: Secondary | ICD-10-CM | POA: Diagnosis not present

## 2020-12-27 MED ORDER — AMPHETAMINE-DEXTROAMPHETAMINE 15 MG PO TABS: 15.0000 mg | ORAL_TABLET | Freq: Three times a day (TID) | ORAL | 0 refills | Status: DC

## 2020-12-27 MED ORDER — ALPRAZOLAM 0.5 MG PO TABS: 0.5000 mg | ORAL_TABLET | Freq: Two times a day (BID) | ORAL | 2 refills | Status: DC | PRN

## 2020-12-27 MED ORDER — HYDROXYZINE HCL 25 MG PO TABS: 12.5000 mg | ORAL_TABLET | Freq: Three times a day (TID) | ORAL | 2 refills | Status: DC | PRN

## 2020-12-27 NOTE — Progress Notes (Signed)
[facility]  Patient ID: Shaun Barron,  MRN: [id number]  PCP: Philip Aspen, Limmie Patricia, MD  Date of Evaluation: 12/27/2020 Time spent:30 minutes  Chief Complaint:  Chief Complaint    ADD; Anxiety; Follow-up      HISTORY/CURRENT STATUS: HPI For routine med check.   Adderall is still working well. States that attention is good without easy distractibility.  Able to focus on things and finish tasks to completion.  Work is going well.  He does have anxiety, worries about a lot of different things.  No panic attacks in several years.  The hydroxyzine does help some but he has had Xanax in the past to take in extreme situations.  It has been helpful.  He would only take it a few times a week if that.  Patient denies loss of interest in usual activities and is able to enjoy things.  Denies decreased energy or motivation.  Appetite has not changed.  No extreme sadness, tearfulness, or feelings of hopelessness.  Sleeps well most of the time.  Denies suicidal or homicidal thoughts.  Denies dizziness, syncope, seizures, numbness, tingling, tremor, tics, unsteady gait, slurred speech, confusion. Denies muscle or joint pain, stiffness, or dystonia.  Individual Medical History/ Review of Systems: Changes? :No   Past medications for mental health diagnoses include: Ritalin, Valium, Xanax, Klonopin, Prozac, Zoloft  Allergies: Patient has no known allergies.  Current Medications:  Current Outpatient Medications:  .  hydrochlorothiazide (HYDRODIURIL) 25 MG tablet, TAKE 1 TABLET(25 MG) BY MOUTH DAILY, Disp: 90 tablet, Rfl: 1 .  losartan (COZAAR) 50 MG tablet, Take 1 tablet by mouth daily., Disp: , Rfl:  .  Multiple Vitamin (MULTIVITAMIN) tablet, Take 1 tablet by mouth daily., Disp: , Rfl:  .  ALPRAZolam (XANAX) 0.5 MG tablet, Take 1 tablet (0.5 mg total) by mouth 2 (two) times daily as needed for anxiety., Disp: 30 tablet, Rfl: 2 .  [START ON 03/18/2021]  amphetamine-dextroamphetamine (ADDERALL) 15 MG tablet, Take 1 tablet by mouth 3 (three) times daily., Disp: 90 tablet, Rfl: 0 .  [START ON 02/17/2021] amphetamine-dextroamphetamine (ADDERALL) 15 MG tablet, Take 1 tablet by mouth 3 (three) times daily., Disp: 90 tablet, Rfl: 0 .  [START ON 01/18/2021] amphetamine-dextroamphetamine (ADDERALL) 15 MG tablet, Take 1 tablet by mouth 3 (three) times daily., Disp: 90 tablet, Rfl: 0 .  hydrOXYzine (ATARAX/VISTARIL) 25 MG tablet, Take 0.5-1 tablets (12.5-25 mg total) by mouth every 8 (eight) hours as needed., Disp: 60 tablet, Rfl: 2 .  lisinopril (ZESTRIL) 5 MG tablet, TAKE 1 TABLET(5 MG) BY MOUTH DAILY (Patient not taking: Reported on 12/27/2020), Disp: 30 tablet, Rfl: 0 Medication Side Effects: none  Family Medical/ Social History: Changes? No  MENTAL HEALTH EXAM:  Blood pressure (!) 142/83, pulse (!) 111.There is no height or weight on file to calculate BMI.  General Appearance: Casual and Well Groomed  Eye Contact:  Good  Speech:  Clear and Coherent and Normal Rate  Volume:  Normal  Mood:  Anxious  Affect:  Anxious  Thought Process:  Goal Directed and Descriptions of Associations: Circumstantial  Orientation:  Full (Time, Place, and Person)  Thought Content: Logical   Suicidal Thoughts:  No  Homicidal Thoughts:  No  Memory:  WNL  Judgement:  Good  Insight:  Good  Psychomotor Activity:  Normal  Concentration:  Concentration: Good  Recall:  Good  Fund of Knowledge: Good  Language: Good  Assets:  Desire for Improvement  ADL's:  Intact  Cognition: WNL  Prognosis:  Good    DIAGNOSES:    ICD-10-CM   1. Anxiety state  F41.1 ALPRAZolam (XANAX) 0.5 MG tablet    Receiving Psychotherapy: No    RECOMMENDATIONS:  PDMP reviewed. I provided 30 minutes of face-to-face time during this encounter, including time spent in records review, medical decision making, and charting. We discussed the anxiety and Xanax is appropriate on rare occasions.  He  understands to take it sparingly.  Benefits, risks, and side effects were discussed and he accepts. Start Xanax 0.5 mg, 1 p.o. twice daily as needed. Continue Adderall 15 mg 1 p.o. 3 times daily. Continue hydroxyzine 25 mg, 1/2-1 p.o. 3 times daily as needed anxiety. Return in 6 months.  Melony Overly, PA-C

## 2021-01-24 DIAGNOSIS — Z1389 Encounter for screening for other disorder: Secondary | ICD-10-CM | POA: Diagnosis not present

## 2021-01-24 DIAGNOSIS — Z1329 Encounter for screening for other suspected endocrine disorder: Secondary | ICD-10-CM | POA: Diagnosis not present

## 2021-01-24 DIAGNOSIS — Z0000 Encounter for general adult medical examination without abnormal findings: Secondary | ICD-10-CM | POA: Diagnosis not present

## 2021-01-24 DIAGNOSIS — Z13220 Encounter for screening for lipoid disorders: Secondary | ICD-10-CM | POA: Diagnosis not present

## 2021-01-24 DIAGNOSIS — I10 Essential (primary) hypertension: Secondary | ICD-10-CM | POA: Diagnosis not present

## 2021-02-01 DIAGNOSIS — E871 Hypo-osmolality and hyponatremia: Secondary | ICD-10-CM | POA: Diagnosis not present

## 2021-02-01 DIAGNOSIS — R739 Hyperglycemia, unspecified: Secondary | ICD-10-CM | POA: Diagnosis not present

## 2021-02-01 DIAGNOSIS — I10 Essential (primary) hypertension: Secondary | ICD-10-CM | POA: Diagnosis not present

## 2021-02-01 DIAGNOSIS — E876 Hypokalemia: Secondary | ICD-10-CM | POA: Diagnosis not present

## 2021-02-27 DIAGNOSIS — I10 Essential (primary) hypertension: Secondary | ICD-10-CM | POA: Diagnosis not present

## 2021-03-01 DIAGNOSIS — I10 Essential (primary) hypertension: Secondary | ICD-10-CM | POA: Diagnosis not present

## 2021-03-11 ENCOUNTER — Telehealth: Payer: Self-pay

## 2021-03-11 NOTE — Telephone Encounter (Signed)
Prior Approval received for AMPHETAMINE-DEXTROAMPHETAMINE 15 MG #90/30 DAY effective 11/21/2020-11/20/2021, Ref # [id number] with [employer] ID# [id number]

## 2021-04-22 ENCOUNTER — Other Ambulatory Visit: Payer: Self-pay

## 2021-04-22 ENCOUNTER — Telehealth: Payer: Self-pay | Admitting: Physician Assistant

## 2021-04-22 MED ORDER — AMPHETAMINE-DEXTROAMPHETAMINE 15 MG PO TABS: 15.0000 mg | ORAL_TABLET | Freq: Three times a day (TID) | ORAL | 0 refills | Status: DC

## 2021-04-22 NOTE — Telephone Encounter (Signed)
Pended.

## 2021-04-22 NOTE — Telephone Encounter (Signed)
Pt requesting Rx for Adderall @ [facility]. Apt 11/10

## 2021-07-04 ENCOUNTER — Ambulatory Visit (INDEPENDENT_AMBULATORY_CARE_PROVIDER_SITE_OTHER): Payer: BC Managed Care – PPO | Admitting: Physician Assistant

## 2021-07-04 ENCOUNTER — Other Ambulatory Visit: Payer: Self-pay

## 2021-07-04 ENCOUNTER — Encounter: Payer: Self-pay | Admitting: Physician Assistant

## 2021-07-04 DIAGNOSIS — F900 Attention-deficit hyperactivity disorder, predominantly inattentive type: Secondary | ICD-10-CM | POA: Diagnosis not present

## 2021-07-04 DIAGNOSIS — F411 Generalized anxiety disorder: Secondary | ICD-10-CM | POA: Diagnosis not present

## 2021-07-04 MED ORDER — AMPHETAMINE-DEXTROAMPHETAMINE 20 MG PO TABS: 20.0000 mg | ORAL_TABLET | Freq: Three times a day (TID) | ORAL | 0 refills | Status: DC

## 2021-07-04 MED ORDER — ALPRAZOLAM 0.5 MG PO TABS: 0.5000 mg | ORAL_TABLET | Freq: Two times a day (BID) | ORAL | 2 refills | Status: DC | PRN

## 2021-07-04 NOTE — Progress Notes (Signed)
[facility]  Patient ID: Shaun Barron,  MRN: [id number]  PCP: Philip Aspen, Limmie Patricia, MD  Date of Evaluation: 07/04/2021 Time spent:20 minutes  Chief Complaint:  Chief Complaint   Anxiety; ADD      HISTORY/CURRENT STATUS: HPI For routine med check.   He is doing well for the most part.  Does not feel like the Adderall works quite as well as it used to.  We have discussed increasing the dose in the past and he is wondering if we could go ahead and try that.  He is able to focus and get things done but gets distracted a bit easier than he had been.    Patient denies loss of interest in usual activities and is able to enjoy things.  Denies decreased energy or motivation.  Appetite has not changed.  No extreme sadness, tearfulness, or feelings of hopelessness.  Denies suicidal or homicidal thoughts.  Work is going well.  He sleeps fine.  He does need the Xanax occasionally and it is helpful.  Denies dizziness, syncope, seizures, numbness, tingling, tremor, tics, unsteady gait, slurred speech, confusion. Denies muscle or joint pain, stiffness, or dystonia.  Individual Medical History/ Review of Systems: Changes? :No   Past medications for mental health diagnoses include: Ritalin, Valium, Xanax, Klonopin, Prozac, Zoloft  Allergies: Patient has no known allergies.  Current Medications:  Current Outpatient Medications:    [START ON 07/20/2021] amphetamine-dextroamphetamine (ADDERALL) 20 MG tablet, Take 1 tablet (20 mg total) by mouth 3 (three) times daily., Disp: 90 tablet, Rfl: 0   hydrOXYzine (ATARAX/VISTARIL) 25 MG tablet, Take 0.5-1 tablets (12.5-25 mg total) by mouth every 8 (eight) hours as needed., Disp: 60 tablet, Rfl: 2   losartan (COZAAR) 50 MG tablet, Take 100 tablets by mouth daily., Disp: , Rfl:    Multiple Vitamin (MULTIVITAMIN) tablet, Take 1 tablet by mouth daily., Disp: , Rfl:    ALPRAZolam (XANAX) 0.5 MG tablet, Take 1 tablet (0.5 mg total) by  mouth 2 (two) times daily as needed for anxiety., Disp: 30 tablet, Rfl: 2   hydrochlorothiazide (HYDRODIURIL) 25 MG tablet, TAKE 1 TABLET(25 MG) BY MOUTH DAILY (Patient not taking: Reported on 07/04/2021), Disp: 90 tablet, Rfl: 1   lisinopril (ZESTRIL) 5 MG tablet, TAKE 1 TABLET(5 MG) BY MOUTH DAILY (Patient not taking: No sig reported), Disp: 30 tablet, Rfl: 0 Medication Side Effects: none  Family Medical/ Social History: Changes? No  MENTAL HEALTH EXAM:  There were no vitals taken for this visit.There is no height or weight on file to calculate BMI.  General Appearance: Casual and Well Groomed  Eye Contact:  Good  Speech:  Clear and Coherent and Normal Rate  Volume:  Normal  Mood:  Euthymic  Affect:  Congruent  Thought Process:  Goal Directed and Descriptions of Associations: Circumstantial  Orientation:  Full (Time, Place, and Person)  Thought Content: Logical   Suicidal Thoughts:  No  Homicidal Thoughts:  No  Memory:  WNL  Judgement:  Good  Insight:  Good  Psychomotor Activity:  Normal  Concentration:  Concentration: Fair and Attention Span: Fair  Recall:  Good  Fund of Knowledge: Good  Language: Good  Assets:  Desire for Improvement  ADL's:  Intact  Cognition: WNL  Prognosis:  Good    DIAGNOSES:    ICD-10-CM   1. Attention deficit hyperactivity disorder (ADHD), predominantly inattentive type  F90.0     2. Anxiety state  F41.1 ALPRAZolam (XANAX) 0.5 MG tablet  Receiving Psychotherapy: No    RECOMMENDATIONS:  PDMP reviewed.  Adderall filled 06/20/2021.  Xanax filled 12/27/2020. I provided 20 minutes of face to face time during this encounter, including time spent before and after the visit in records review, medical decision making, and charting.  We disc increasing Adderall. Will give 1 Rx, he'll call in 3 weeks or so to let me know how well it's working, and I'll send in the next 3 Rxs. He knows about the national shortage, so might have to go back to 15 mg  dose due to that.  Continue  Xanax 0.5 mg, 1 p.o. twice daily as needed. He takes it sparingly.  Increase Adderall to 20 mg 1 p.o. 3 times daily. Continue hydroxyzine 25 mg, 1/2-1 p.o. 3 times daily as needed anxiety. Return in 6 months.  Melony Overly, PA-C

## 2021-07-29 DIAGNOSIS — I10 Essential (primary) hypertension: Secondary | ICD-10-CM | POA: Diagnosis not present

## 2021-07-29 DIAGNOSIS — M25561 Pain in right knee: Secondary | ICD-10-CM | POA: Diagnosis not present

## 2021-08-04 DIAGNOSIS — M79604 Pain in right leg: Secondary | ICD-10-CM | POA: Diagnosis not present

## 2021-08-07 DIAGNOSIS — M25561 Pain in right knee: Secondary | ICD-10-CM | POA: Diagnosis not present

## 2021-08-21 ENCOUNTER — Telehealth: Payer: Self-pay | Admitting: Physician Assistant

## 2021-08-21 ENCOUNTER — Other Ambulatory Visit: Payer: Self-pay

## 2021-08-21 MED ORDER — AMPHETAMINE-DEXTROAMPHETAMINE 20 MG PO TABS: 20.0000 mg | ORAL_TABLET | Freq: Three times a day (TID) | ORAL | 0 refills | Status: DC

## 2021-08-21 NOTE — Telephone Encounter (Signed)
Pt LVM for refill of Adderall 20mg .  Next appt 5/10

## 2021-08-21 NOTE — Telephone Encounter (Signed)
Pended.

## 2021-10-01 ENCOUNTER — Other Ambulatory Visit: Payer: Self-pay

## 2021-10-01 ENCOUNTER — Telehealth: Payer: Self-pay | Admitting: Physician Assistant

## 2021-10-01 MED ORDER — AMPHETAMINE-DEXTROAMPHETAMINE 5 MG PO TABS: ORAL_TABLET | ORAL | 0 refills | Status: DC

## 2021-10-01 MED ORDER — AMPHETAMINE-DEXTROAMPHETAMINE 15 MG PO TABS: ORAL_TABLET | ORAL | 0 refills | Status: DC

## 2021-10-01 NOTE — Telephone Encounter (Signed)
Pt advised pharmacy doesn't have Adderall 20mg  pills but has 15mg  and 5mg  pills.  Pls send script to:   [facility] - [place], [place] - [address] AT [facility] OF [address]  [address], [id number]  Phone:  [phone]  Fax:  [phone]   Next appt 5/10

## 2021-10-01 NOTE — Telephone Encounter (Signed)
Pended.

## 2021-10-01 NOTE — Telephone Encounter (Signed)
Dose change ok?

## 2021-10-01 NOTE — Telephone Encounter (Signed)
Yes, that's fine. Please cancel any other Adderall Rx on file and pend these for me. Thank you.

## 2021-10-31 ENCOUNTER — Other Ambulatory Visit: Payer: Self-pay

## 2021-10-31 ENCOUNTER — Telehealth: Payer: Self-pay | Admitting: Physician Assistant

## 2021-10-31 DIAGNOSIS — F411 Generalized anxiety disorder: Secondary | ICD-10-CM

## 2021-10-31 NOTE — Telephone Encounter (Signed)
Pended.

## 2021-10-31 NOTE — Telephone Encounter (Signed)
Pt called 4:01 requesting Rx Adderall @ [place]. Had in stock when he called. Usually takes 60 mg daily. May have to send as 15 mg & 5 mg as before, pt stated. Apt 5/10. Contact # [phone] ?

## 2021-11-01 MED ORDER — AMPHETAMINE-DEXTROAMPHETAMINE 5 MG PO TABS: ORAL_TABLET | ORAL | 0 refills | Status: DC

## 2021-11-01 MED ORDER — AMPHETAMINE-DEXTROAMPHETAMINE 15 MG PO TABS: ORAL_TABLET | ORAL | 0 refills | Status: DC

## 2021-11-27 ENCOUNTER — Other Ambulatory Visit: Payer: Self-pay | Admitting: Physician Assistant

## 2021-11-27 DIAGNOSIS — F411 Generalized anxiety disorder: Secondary | ICD-10-CM

## 2021-12-04 ENCOUNTER — Other Ambulatory Visit: Payer: Self-pay | Admitting: Physician Assistant

## 2021-12-04 ENCOUNTER — Telehealth: Payer: Self-pay | Admitting: Physician Assistant

## 2021-12-04 MED ORDER — AMPHETAMINE-DEXTROAMPHETAMINE 20 MG PO TABS: 20.0000 mg | ORAL_TABLET | Freq: Three times a day (TID) | ORAL | 0 refills | Status: DC

## 2021-12-04 NOTE — Telephone Encounter (Signed)
Next visit is 01/01/22. Ajahni called requesting refill on Adderall 20 mg. He has checked with his pharmacy and they have it in stock. Pharmacy is: ? ?[id number] - [place], [place] - [address] ? ?Phone:  [phone]  ?Fax:  [phone]  ? ? ? ? ? ?

## 2021-12-04 NOTE — Telephone Encounter (Signed)
Rx was sent  

## 2022-01-01 ENCOUNTER — Encounter: Payer: Self-pay | Admitting: Physician Assistant

## 2022-01-01 ENCOUNTER — Ambulatory Visit (INDEPENDENT_AMBULATORY_CARE_PROVIDER_SITE_OTHER): Payer: BC Managed Care – PPO | Admitting: Physician Assistant

## 2022-01-01 DIAGNOSIS — F900 Attention-deficit hyperactivity disorder, predominantly inattentive type: Secondary | ICD-10-CM | POA: Diagnosis not present

## 2022-01-01 DIAGNOSIS — F411 Generalized anxiety disorder: Secondary | ICD-10-CM | POA: Diagnosis not present

## 2022-01-01 MED ORDER — ALPRAZOLAM 0.5 MG PO TABS: ORAL_TABLET | ORAL | 0 refills | Status: DC

## 2022-01-01 MED ORDER — AMPHETAMINE-DEXTROAMPHETAMINE 20 MG PO TABS: 20.0000 mg | ORAL_TABLET | Freq: Three times a day (TID) | ORAL | 0 refills | Status: DC

## 2022-01-01 MED ORDER — HYDROXYZINE HCL 25 MG PO TABS: 12.5000 mg | ORAL_TABLET | Freq: Three times a day (TID) | ORAL | 2 refills | Status: DC | PRN

## 2022-01-01 NOTE — Progress Notes (Signed)
[facility] ? ?Patient ID: Shaun Barron,  ?MRN: [id number] ? ?PCP: Philip Aspen, Limmie Patricia, MD ? ?Date of Evaluation: 01/01/2022 ?Time spent:20 minutes ? ?Chief Complaint:  ?Chief Complaint   ?Anxiety; Depression; ADHD; Follow-up ?  ? ? ?HISTORY/CURRENT STATUS: ?HPI For routine med check.  ? ?Doing well.  Feels like all medications are working just fine. Patient denies loss of interest in usual activities and is able to enjoy things.  Denies decreased energy.  Denies decreased motivation.  Work is going well.  ADLs and personal hygiene are normal.  Appetite has not changed.  Weight is stable.  No extreme sadness, tearfulness, or feelings of hopelessness.  Sleeps well most of the time.  Does have anxiety, states he is taking the Xanax maybe 3 times a week.  Not having panic attacks so much as just generalized sense of unease when he gets overwhelmed at work or at home.  Denies suicidal or homicidal thoughts. ? ?States that attention is good without easy distractibility.  Able to focus on things and finish tasks to completion.  ? ?Patient denies increased energy with decreased need for sleep, no increased talkativeness, no racing thoughts, no impulsivity or risky behaviors, no increased spending, no increased libido, no grandiosity, no increased irritability or anger, and no hallucinations. ? ?Denies dizziness, syncope, seizures, numbness, tingling, tremor, tics, unsteady gait, slurred speech, confusion. Denies muscle or joint pain, stiffness, or dystonia. ? ?Individual Medical History/ Review of Systems: Changes? :No  ? ?Past medications for mental health diagnoses include: ?Ritalin, Valium, Xanax, Klonopin, Prozac, Zoloft ? ?Allergies: Patient has no known allergies. ? ?Current Medications:  ?Current Outpatient Medications:  ?  losartan (COZAAR) 50 MG tablet, Take 100 tablets by mouth daily., Disp: , Rfl:  ?  Multiple Vitamin (MULTIVITAMIN) tablet, Take 1 tablet by mouth daily., Disp: , Rfl:  ?   potassium chloride (KLOR-CON) 10 MEQ tablet, Take 10 mEq by mouth daily., Disp: , Rfl:  ?  ALPRAZolam (XANAX) 0.5 MG tablet, TAKE 1 TABLET(0.5 MG) BY MOUTH TWICE DAILY AS NEEDED FOR ANXIETY, Disp: 60 tablet, Rfl: 0 ?  [START ON 03/01/2022] amphetamine-dextroamphetamine (ADDERALL) 20 MG tablet, Take 1 tablet (20 mg total) by mouth 3 (three) times daily., Disp: 90 tablet, Rfl: 0 ?  [START ON 01/31/2022] amphetamine-dextroamphetamine (ADDERALL) 20 MG tablet, Take 1 tablet (20 mg total) by mouth 3 (three) times daily., Disp: 90 tablet, Rfl: 0 ?  amphetamine-dextroamphetamine (ADDERALL) 20 MG tablet, Take 1 tablet (20 mg total) by mouth 3 (three) times daily., Disp: 90 tablet, Rfl: 0 ?  hydrochlorothiazide (HYDRODIURIL) 25 MG tablet, TAKE 1 TABLET(25 MG) BY MOUTH DAILY (Patient not taking: Reported on 07/04/2021), Disp: 90 tablet, Rfl: 1 ?  hydrOXYzine (ATARAX) 25 MG tablet, Take 0.5-1 tablets (12.5-25 mg total) by mouth every 8 (eight) hours as needed., Disp: 60 tablet, Rfl: 2 ?  lisinopril (ZESTRIL) 5 MG tablet, TAKE 1 TABLET(5 MG) BY MOUTH DAILY (Patient not taking: Reported on 12/27/2020), Disp: 30 tablet, Rfl: 0 ?Medication Side Effects: none ? ?Family Medical/ Social History: Changes? No ? ?MENTAL HEALTH EXAM: ? ?There were no vitals taken for this visit.There is no height or weight on file to calculate BMI.  ?General Appearance: Casual and Well Groomed  ?Eye Contact:  Good  ?Speech:  Clear and Coherent and Normal Rate  ?Volume:  Normal  ?Mood:  Euthymic  ?Affect:  Congruent  ?Thought Process:  Goal Directed and Descriptions of Associations: Circumstantial  ?Orientation:  Full (Time, Place, and Person)  ?  Thought Content: Logical   ?Suicidal Thoughts:  No  ?Homicidal Thoughts:  No  ?Memory:  WNL  ?Judgement:  Good  ?Insight:  Good  ?Psychomotor Activity:  Normal  ?Concentration:  Concentration: Good and Attention Span: Good  ?Recall:  Good  ?Fund of Knowledge: Good  ?Language: Good  ?Assets:  Desire for Improvement   ?ADL's:  Intact  ?Cognition: WNL  ?Prognosis:  Good  ? ? ?DIAGNOSES:  ?  ICD-10-CM   ?1. Attention deficit hyperactivity disorder (ADHD), predominantly inattentive type  F90.0   ?  ?2. Anxiety state  F41.1 ALPRAZolam (XANAX) 0.5 MG tablet  ?  ? ? ?Receiving Psychotherapy: No  ? ? ?RECOMMENDATIONS:  ?PDMP reviewed.  Adderall filled 12/04/2021.  Xanax filled 11/28/2021. ?I provided 20 minutes of face to face time during this encounter, including time spent before and after the visit in records review, medical decision making, counseling pertinent to today's visit, and charting.  ?He is doing well so no changes need to be made. ? ?Continue  Xanax 0.5 mg, 1 p.o. twice daily as needed. He takes it sparingly.  ?Continue Adderall 20 mg, 1 p.o. 3 times daily. ?Continue hydroxyzine 25 mg, 1/2-1 p.o. 3 times daily as needed anxiety. ?Return in 6 months. ? ?Melony Overly, PA-C  ?

## 2022-02-20 ENCOUNTER — Other Ambulatory Visit: Payer: Self-pay | Admitting: Physician Assistant

## 2022-02-20 DIAGNOSIS — F411 Generalized anxiety disorder: Secondary | ICD-10-CM

## 2022-03-21 ENCOUNTER — Telehealth: Payer: Self-pay | Admitting: Physician Assistant

## 2022-03-21 ENCOUNTER — Other Ambulatory Visit: Payer: Self-pay

## 2022-03-21 NOTE — Telephone Encounter (Signed)
Pt called 4:01pm. Requests RF of Adderall RX to go to: [id number] - [place], [place] - [address]  A[address] 11/13

## 2022-03-21 NOTE — Telephone Encounter (Signed)
Called pharmacy because I show patient should have a RF available. He did have one stored in his profile and I asked pharmacy to fill. Notified patient.

## 2022-03-28 ENCOUNTER — Telehealth: Payer: Self-pay | Admitting: Physician Assistant

## 2022-03-28 ENCOUNTER — Other Ambulatory Visit: Payer: Self-pay

## 2022-03-28 MED ORDER — AMPHETAMINE-DEXTROAMPHETAMINE 20 MG PO TABS: 20.0000 mg | ORAL_TABLET | Freq: Three times a day (TID) | ORAL | 0 refills | Status: DC

## 2022-03-28 MED ORDER — AMPHETAMINE-DEXTROAMPHETAMINE 20 MG PO TABS
20.0000 mg | ORAL_TABLET | Freq: Three times a day (TID) | ORAL | 0 refills | Status: DC
Start: 2022-04-27 — End: 2022-09-05

## 2022-03-28 NOTE — Telephone Encounter (Signed)
Pended.

## 2022-03-28 NOTE — Telephone Encounter (Signed)
Next appt is 07/07/22. Tiny called and said the pharmacy still doesn't have his RX for Adderall 20 mg? His phone number is [phone].

## 2022-04-02 ENCOUNTER — Other Ambulatory Visit: Payer: Self-pay | Admitting: Physician Assistant

## 2022-04-02 MED ORDER — AMPHETAMINE-DEXTROAMPHETAMINE 30 MG PO TABS: 60.0000 mg | ORAL_TABLET | Freq: Every day | ORAL | 0 refills | Status: DC

## 2022-04-02 NOTE — Telephone Encounter (Signed)
Yes, that's fine. Please pend for me. Thanks.

## 2022-04-02 NOTE — Telephone Encounter (Signed)
Shaun Barron called today at 1:30 to request change in his Adderall prescription.  The pharmacy doesn't have 20mg , but they have 30mg  so he is wondering if you could change the prescription from 20mg  3/day to 30mg  2/day.  [facility] [place]

## 2022-04-02 NOTE — Telephone Encounter (Signed)
Please review

## 2022-04-02 NOTE — Telephone Encounter (Signed)
Pended.

## 2022-04-10 ENCOUNTER — Other Ambulatory Visit: Payer: Self-pay | Admitting: Physician Assistant

## 2022-04-10 DIAGNOSIS — F411 Generalized anxiety disorder: Secondary | ICD-10-CM

## 2022-06-02 ENCOUNTER — Telehealth: Payer: Self-pay | Admitting: Physician Assistant

## 2022-06-02 NOTE — Telephone Encounter (Signed)
Pt LVM pharmacy out of stock 20 mg. Asking to send 30 mg # 76. Contact # [phone] Apt 11/13. [facility]

## 2022-06-03 ENCOUNTER — Other Ambulatory Visit: Payer: Self-pay

## 2022-06-03 MED ORDER — AMPHETAMINE-DEXTROAMPHETAMINE 30 MG PO TABS: 60.0000 mg | ORAL_TABLET | Freq: Every day | ORAL | 0 refills | Status: DC

## 2022-06-03 NOTE — Telephone Encounter (Signed)
Pended.

## 2022-07-07 ENCOUNTER — Ambulatory Visit (INDEPENDENT_AMBULATORY_CARE_PROVIDER_SITE_OTHER): Payer: Self-pay | Admitting: Physician Assistant

## 2022-07-07 DIAGNOSIS — Z91199 Patient's noncompliance with other medical treatment and regimen due to unspecified reason: Secondary | ICD-10-CM

## 2022-07-07 NOTE — Progress Notes (Signed)
No show

## 2022-07-31 ENCOUNTER — Telehealth: Payer: Self-pay | Admitting: Physician Assistant

## 2022-07-31 ENCOUNTER — Other Ambulatory Visit: Payer: Self-pay

## 2022-07-31 MED ORDER — AMPHETAMINE-DEXTROAMPHETAMINE 20 MG PO TABS
20.0000 mg | ORAL_TABLET | Freq: Three times a day (TID) | ORAL | 0 refills | Status: DC
Start: 2022-07-31 — End: 2022-09-05

## 2022-07-31 NOTE — Telephone Encounter (Signed)
Pt called and made an appt for 09/05/22. He needs a refill on his adderall 20 mg. [facility] [place] [address]. They do have it in stock

## 2022-07-31 NOTE — Telephone Encounter (Signed)
Pended.

## 2022-09-05 ENCOUNTER — Ambulatory Visit (INDEPENDENT_AMBULATORY_CARE_PROVIDER_SITE_OTHER): Payer: BC Managed Care – PPO | Admitting: Physician Assistant

## 2022-09-05 ENCOUNTER — Encounter: Payer: Self-pay | Admitting: Physician Assistant

## 2022-09-05 DIAGNOSIS — F411 Generalized anxiety disorder: Secondary | ICD-10-CM | POA: Diagnosis not present

## 2022-09-05 DIAGNOSIS — F900 Attention-deficit hyperactivity disorder, predominantly inattentive type: Secondary | ICD-10-CM | POA: Diagnosis not present

## 2022-09-05 MED ORDER — AMPHETAMINE-DEXTROAMPHETAMINE 20 MG PO TABS: 20.0000 mg | ORAL_TABLET | Freq: Three times a day (TID) | ORAL | 0 refills | Status: DC

## 2022-09-05 MED ORDER — ALPRAZOLAM 0.5 MG PO TABS: 0.5000 mg | ORAL_TABLET | Freq: Two times a day (BID) | ORAL | 1 refills | Status: DC | PRN

## 2022-09-05 MED ORDER — HYDROXYZINE HCL 25 MG PO TABS: 12.5000 mg | ORAL_TABLET | Freq: Three times a day (TID) | ORAL | 2 refills | Status: DC | PRN

## 2022-09-05 NOTE — Progress Notes (Unsigned)
[facility]  Patient ID: Shaun Barron,  MRN: [id number]  PCP: Isaac Bliss, Rayford Halsted, MD  Date of Evaluation: 09/05/2022 Time spent:20 minutes  Chief Complaint:  Chief Complaint   Anxiety; ADHD; Follow-up    HISTORY/CURRENT STATUS: HPI For routine med check.   Doing well. States that attention is good without easy distractibility.  Able to focus on things and finish tasks to completion.   Patient is able to enjoy things.  Energy and motivation are good.  Work is going well.  Considering going back to school in [employer].  No extreme sadness, tearfulness, or feelings of hopelessness.   ADLs and personal hygiene are normal.  Appetite is nl.  Weight is stable.  Denies suicidal or homicidal thoughts.  Still has anxiety at times.  More trouble sleeping than anything.  He has trouble falling asleep and sometimes staying asleep.  That is the only time he takes the Xanax and usually the hydroxyzine as well.  Denies dizziness, syncope, seizures, numbness, tingling, tremor, tics, unsteady gait, slurred speech, confusion. Denies muscle or joint pain, stiffness, or dystonia.  Individual Medical History/ Review of Systems: Changes? :No   Past medications for mental health diagnoses include: Ritalin, Valium, Xanax, Klonopin, Prozac, Zoloft  Allergies: Patient has no known allergies.  Current Medications:  Current Outpatient Medications:    losartan (COZAAR) 50 MG tablet, Take 100 tablets by mouth daily., Disp: , Rfl:    Multiple Vitamin (MULTIVITAMIN) tablet, Take 1 tablet by mouth daily., Disp: , Rfl:    potassium chloride (KLOR-CON) 10 MEQ tablet, Take 10 mEq by mouth daily., Disp: , Rfl:    ALPRAZolam (XANAX) 0.5 MG tablet, Take 1 tablet (0.5 mg total) by mouth 2 (two) times daily as needed for anxiety., Disp: 60 tablet, Rfl: 1   [START ON 09/28/2022] amphetamine-dextroamphetamine (ADDERALL) 20 MG tablet, Take 1 tablet (20 mg total) by mouth 3 (three) times daily.,  Disp: 90 tablet, Rfl: 0   [START ON 10/26/2022] amphetamine-dextroamphetamine (ADDERALL) 20 MG tablet, Take 1 tablet (20 mg total) by mouth 3 (three) times daily., Disp: 90 tablet, Rfl: 0   [START ON 11/25/2022] amphetamine-dextroamphetamine (ADDERALL) 20 MG tablet, Take 1 tablet (20 mg total) by mouth 3 (three) times daily., Disp: 90 tablet, Rfl: 0   hydrochlorothiazide (HYDRODIURIL) 25 MG tablet, TAKE 1 TABLET(25 MG) BY MOUTH DAILY (Patient not taking: Reported on 07/04/2021), Disp: 90 tablet, Rfl: 1   hydrOXYzine (ATARAX) 25 MG tablet, Take 0.5-1 tablets (12.5-25 mg total) by mouth every 8 (eight) hours as needed., Disp: 60 tablet, Rfl: 2   lisinopril (ZESTRIL) 5 MG tablet, TAKE 1 TABLET(5 MG) BY MOUTH DAILY (Patient not taking: Reported on 12/27/2020), Disp: 30 tablet, Rfl: 0 Medication Side Effects: none  Family Medical/ Social History: Changes? No  MENTAL HEALTH EXAM:  There were no vitals taken for this visit.There is no height or weight on file to calculate BMI.  General Appearance: Casual and Well Groomed  Eye Contact:  Good  Speech:  Clear and Coherent and Normal Rate  Volume:  Normal  Mood:  Euthymic  Affect:  Congruent  Thought Process:  Goal Directed and Descriptions of Associations: Circumstantial  Orientation:  Full (Time, Place, and Person)  Thought Content: Logical   Suicidal Thoughts:  No  Homicidal Thoughts:  No  Memory:  WNL  Judgement:  Good  Insight:  Good  Psychomotor Activity:  Normal  Concentration:  Concentration: Good and Attention Span: Good  Recall:  Good  Fund of Knowledge:  Good  Language: Good  Assets:  Communication Skills Desire for Improvement Financial Resources/Insurance Housing Transportation Vocational/Educational  ADL's:  Intact  Cognition: WNL  Prognosis:  Good   DIAGNOSES:    ICD-10-CM   1. Anxiety state  F41.1 ALPRAZolam (XANAX) 0.5 MG tablet      Receiving Psychotherapy: No   RECOMMENDATIONS:  PDMP reviewed.  Adderall filled  08/29/2022.  Xanax filled 06/03/2022.   I provided 20 minutes of face to face time during this encounter, including time spent before and after the visit in records review, medical decision making, counseling pertinent to today's visit, and charting.     Continue  Xanax 0.5 mg, 1 p.o. twice daily as needed. He takes it sparingly.  Continue Adderall 20 mg, 1 p.o. 3 times daily. Continue Hydroxyzine 25 mg, 1/2-1 po q8h prn Return in 6 months.  Donnal Moat, PA-C

## 2022-12-24 ENCOUNTER — Telehealth: Payer: Self-pay | Admitting: Physician Assistant

## 2022-12-24 NOTE — Telephone Encounter (Signed)
Pt called asking for a refill on his adderall 20 mg. Pharmacy is [facility] on [address] in [place]. Next appt is 7/2

## 2022-12-25 ENCOUNTER — Other Ambulatory Visit: Payer: Self-pay | Admitting: Physician Assistant

## 2022-12-25 MED ORDER — AMPHETAMINE-DEXTROAMPHETAMINE 20 MG PO TABS: 20.0000 mg | ORAL_TABLET | Freq: Three times a day (TID) | ORAL | 0 refills | Status: DC

## 2022-12-25 NOTE — Telephone Encounter (Signed)
Sent!

## 2023-03-09 ENCOUNTER — Ambulatory Visit (INDEPENDENT_AMBULATORY_CARE_PROVIDER_SITE_OTHER): Payer: BC Managed Care – PPO | Admitting: Physician Assistant

## 2023-03-09 ENCOUNTER — Encounter: Payer: Self-pay | Admitting: Physician Assistant

## 2023-03-09 DIAGNOSIS — F411 Generalized anxiety disorder: Secondary | ICD-10-CM | POA: Diagnosis not present

## 2023-03-09 DIAGNOSIS — F900 Attention-deficit hyperactivity disorder, predominantly inattentive type: Secondary | ICD-10-CM | POA: Diagnosis not present

## 2023-03-09 MED ORDER — AMPHETAMINE-DEXTROAMPHETAMINE 20 MG PO TABS: 20.0000 mg | ORAL_TABLET | Freq: Three times a day (TID) | ORAL | 0 refills | Status: DC

## 2023-03-09 MED ORDER — HYDROXYZINE HCL 25 MG PO TABS: 12.5000 mg | ORAL_TABLET | Freq: Three times a day (TID) | ORAL | 11 refills | Status: DC | PRN

## 2023-03-09 MED ORDER — ALPRAZOLAM 0.5 MG PO TABS: 0.5000 mg | ORAL_TABLET | Freq: Two times a day (BID) | ORAL | 1 refills | Status: DC | PRN

## 2023-03-09 NOTE — Progress Notes (Signed)
[facility]  Patient ID: BROADY LAFOY,  MRN: [id number]  PCP: Philip Aspen, Limmie Patricia, MD  Date of Evaluation: 03/09/2023 Time spent: 22 minutes  Chief Complaint:  Chief Complaint   Anxiety; Depression; Follow-up    HISTORY/CURRENT STATUS: HPI For routine med check.   Shaun Barron is doing well.  He feels like the medications are still beneficial. States that attention is good without easy distractibility.  Able to focus on things and finish tasks to completion.   Patient is able to enjoy things.  Energy and motivation are good.  Work is going well.   No extreme sadness, tearfulness, or feelings of hopelessness.  He does not take the hydroxyzine every night but if he goes to bed too early when he is not extremely tired his mind will race and he cannot go to sleep.  ADLs and personal hygiene are normal.  Appetite has not changed.  Weight is stable.  Denies suicidal or homicidal thoughts.  Denies dizziness, syncope, seizures, numbness, tingling, tremor, tics, unsteady gait, slurred speech, confusion. Denies muscle or joint pain, stiffness, or dystonia.  Individual Medical History/ Review of Systems: Changes? :No   Past medications for mental health diagnoses include: Ritalin, Valium, Xanax, Klonopin, Prozac, Zoloft  Allergies: Patient has no known allergies.  Current Medications:  Current Outpatient Medications:    losartan (COZAAR) 50 MG tablet, Take 100 tablets by mouth daily., Disp: , Rfl:    Multiple Vitamin (MULTIVITAMIN) tablet, Take 1 tablet by mouth daily., Disp: , Rfl:    potassium chloride (KLOR-CON) 10 MEQ tablet, Take 10 mEq by mouth daily., Disp: , Rfl:    ALPRAZolam (XANAX) 0.5 MG tablet, Take 1 tablet (0.5 mg total) by mouth 2 (two) times daily as needed for anxiety., Disp: 60 tablet, Rfl: 1   amphetamine-dextroamphetamine (ADDERALL) 20 MG tablet, Take 1 tablet (20 mg total) by mouth 3 (three) times daily., Disp: 90 tablet, Rfl: 0   [START ON 04/23/2023]  amphetamine-dextroamphetamine (ADDERALL) 20 MG tablet, Take 1 tablet (20 mg total) by mouth 3 (three) times daily., Disp: 90 tablet, Rfl: 0   [START ON 05/23/2023] amphetamine-dextroamphetamine (ADDERALL) 20 MG tablet, Take 1 tablet (20 mg total) by mouth 3 (three) times daily., Disp: 90 tablet, Rfl: 0   hydrochlorothiazide (HYDRODIURIL) 25 MG tablet, TAKE 1 TABLET(25 MG) BY MOUTH DAILY (Patient not taking: Reported on 07/04/2021), Disp: 90 tablet, Rfl: 1   hydrOXYzine (ATARAX) 25 MG tablet, Take 0.5-1 tablets (12.5-25 mg total) by mouth every 8 (eight) hours as needed., Disp: 60 tablet, Rfl: 11   lisinopril (ZESTRIL) 5 MG tablet, TAKE 1 TABLET(5 MG) BY MOUTH DAILY (Patient not taking: Reported on 12/27/2020), Disp: 30 tablet, Rfl: 0 Medication Side Effects: none  Family Medical/ Social History: Changes? No  MENTAL HEALTH EXAM:  There were no vitals taken for this visit.There is no height or weight on file to calculate BMI.  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 Attention deficit hyperactivity disorder (ADHD), predominantly inattentive type  F90.0     2. Anxiety state  F41.1 ALPRAZolam (XANAX) 0.5 MG tablet     Receiving  Psychotherapy: No   RECOMMENDATIONS:  PDMP reviewed.  Adderall filled 02/23/2023.  Xanax filled 11/24/2022. I provided 22 minutes of face to face time during this encounter, including time spent before and after the visit in records review, medical decision making, counseling pertinent to today's visit, and charting.   He is doing well so no need to change medications or doses.  Continue  Xanax 0.5 mg, 1 p.o. twice daily as needed. He takes it sparingly.  Continue Adderall 20 mg, 1 p.o. 3 times daily. Continue Hydroxyzine 25 mg, 1/2-1 po q8h prn Return in 6 months.  Melony Overly, PA-C

## 2023-03-25 ENCOUNTER — Telehealth: Payer: Self-pay | Admitting: Physician Assistant

## 2023-03-25 NOTE — Telephone Encounter (Signed)
Pt tried to fill July rx for Adderall 20mg  at current pharmacy and they are out of it. Can we please resend the Adderall RX to [facility] on [address] in [place], [place]?

## 2023-03-25 NOTE — Telephone Encounter (Signed)
Called first pharmacy to cancel and they filled Rx. Called patient and he said he called yesterday and was told they didn't have in stock.  Told patient that Rx was ready for pickup.

## 2023-06-19 ENCOUNTER — Telehealth: Payer: Self-pay | Admitting: Physician Assistant

## 2023-06-19 ENCOUNTER — Other Ambulatory Visit: Payer: Self-pay

## 2023-06-19 NOTE — Telephone Encounter (Signed)
Pended.

## 2023-06-19 NOTE — Telephone Encounter (Signed)
Pt called asking for a refill on his adderall 20 mg. Pharmacy is [facility] on [address] in [place]

## 2023-06-22 MED ORDER — AMPHETAMINE-DEXTROAMPHETAMINE 20 MG PO TABS: 20.0000 mg | ORAL_TABLET | Freq: Three times a day (TID) | ORAL | 0 refills | Status: DC

## 2023-07-21 ENCOUNTER — Telehealth: Payer: Self-pay | Admitting: Physician Assistant

## 2023-07-21 ENCOUNTER — Other Ambulatory Visit: Payer: Self-pay

## 2023-07-21 MED ORDER — AMPHETAMINE-DEXTROAMPHETAMINE 20 MG PO TABS: 20.0000 mg | ORAL_TABLET | Freq: Three times a day (TID) | ORAL | 0 refills | Status: DC

## 2023-07-21 NOTE — Telephone Encounter (Signed)
Pended 3 RF for Adderall #90 to the requested pharmacy.

## 2023-07-21 NOTE — Telephone Encounter (Signed)
Pt called at 1:31p to refill Adderall to    [id number] - [place], [place] - [address], [place] [address] Phone: [phone]  Fax: [phone]   Next appt 1/15

## 2023-09-09 ENCOUNTER — Ambulatory Visit (INDEPENDENT_AMBULATORY_CARE_PROVIDER_SITE_OTHER): Payer: BC Managed Care – PPO | Admitting: Physician Assistant

## 2023-09-09 ENCOUNTER — Encounter: Payer: Self-pay | Admitting: Physician Assistant

## 2023-09-09 DIAGNOSIS — F411 Generalized anxiety disorder: Secondary | ICD-10-CM | POA: Diagnosis not present

## 2023-09-09 DIAGNOSIS — F900 Attention-deficit hyperactivity disorder, predominantly inattentive type: Secondary | ICD-10-CM

## 2023-09-09 MED ORDER — AMPHETAMINE-DEXTROAMPHETAMINE 20 MG PO TABS: 20.0000 mg | ORAL_TABLET | Freq: Three times a day (TID) | ORAL | 0 refills | Status: DC

## 2023-09-09 MED ORDER — ALPRAZOLAM 0.5 MG PO TABS: 0.5000 mg | ORAL_TABLET | Freq: Two times a day (BID) | ORAL | 1 refills | Status: DC | PRN

## 2023-09-09 NOTE — Progress Notes (Signed)
[facility]  Patient ID: Shaun Barron,  MRN: [id number]  PCP: No primary care provider on file.  Date of Evaluation: 09/09/2023 Time spent:20 minutes  Chief Complaint:  Chief Complaint   Anxiety; ADD; Follow-up    HISTORY/CURRENT STATUS: HPI For routine med check.   Feels like his meds are still working well. Doesn't have anxiety too often, does take Xanax occas and it helps when he is overwhelmed. Doesn't have PA but feels like he could if he doesn't take it or the hydroxyzine.  States that attention is good without easy distractibility.  Able to focus on things and finish tasks to completion.   Patient is able to enjoy things.  Energy and motivation are good.  Work is going well.   No extreme sadness, tearfulness, or feelings of hopelessness.  Sleeps well most of the time. ADLs and personal hygiene are normal.  No change in memory.  Appetite has not changed.  Weight is stable.   Denies suicidal or homicidal thoughts.  Denies dizziness, syncope, seizures, numbness, tingling, tremor, tics, unsteady gait, slurred speech, confusion. Denies muscle or joint pain, stiffness, or dystonia.  Individual Medical History/ Review of Systems: Changes? :No   Past medications for mental health diagnoses include: Ritalin, Valium, Xanax, Klonopin, Prozac, Zoloft  Allergies: Patient has no known allergies.  Current Medications:  Current Outpatient Medications:    hydrOXYzine (ATARAX) 25 MG tablet, Take 0.5-1 tablets (12.5-25 mg total) by mouth every 8 (eight) hours as needed., Disp: 60 tablet, Rfl: 11   losartan (COZAAR) 50 MG tablet, Take 100 tablets by mouth daily., Disp: , Rfl:    Multiple Vitamin (MULTIVITAMIN) tablet, Take 1 tablet by mouth daily., Disp: , Rfl:    potassium chloride (KLOR-CON) 10 MEQ tablet, Take 10 mEq by mouth daily., Disp: , Rfl:    ALPRAZolam (XANAX) 0.5 MG tablet, Take 1 tablet (0.5 mg total) by mouth 2 (two) times daily as needed for anxiety., Disp: 60  tablet, Rfl: 1   [START ON 09/16/2023] amphetamine-dextroamphetamine (ADDERALL) 20 MG tablet, Take 1 tablet (20 mg total) by mouth 3 (three) times daily., Disp: 90 tablet, Rfl: 0   [START ON 10/16/2023] amphetamine-dextroamphetamine (ADDERALL) 20 MG tablet, Take 1 tablet (20 mg total) by mouth 3 (three) times daily., Disp: 90 tablet, Rfl: 0   [START ON 11/12/2023] amphetamine-dextroamphetamine (ADDERALL) 20 MG tablet, Take 1 tablet (20 mg total) by mouth 3 (three) times daily., Disp: 90 tablet, Rfl: 0   hydrochlorothiazide (HYDRODIURIL) 25 MG tablet, TAKE 1 TABLET(25 MG) BY MOUTH DAILY (Patient not taking: Reported on 09/09/2023), Disp: 90 tablet, Rfl: 1   lisinopril (ZESTRIL) 5 MG tablet, TAKE 1 TABLET(5 MG) BY MOUTH DAILY (Patient not taking: Reported on 09/09/2023), Disp: 30 tablet, Rfl: 0 Medication Side Effects: none  Family Medical/ Social History: Changes? No  MENTAL HEALTH EXAM:  There were no vitals taken for this visit.There is no height or weight on file to calculate BMI.  General Appearance: Casual and Well Groomed  Eye Contact:  Good  Speech:  Clear and Coherent and Normal Rate  Volume:  Normal  Mood:  Euthymic  Affect:  Congruent  Thought Process:  Goal Directed and Descriptions of Associations: Circumstantial  Orientation:  Full (Time, Place, and Person)  Thought Content: Logical   Suicidal Thoughts:  No  Homicidal Thoughts:  No  Memory:  WNL  Judgement:  Good  Insight:  Good  Psychomotor Activity:  Normal  Concentration:  Concentration: Good and Attention Span: Good  Recall:  [place] Major of Knowledge: Good  Language: Good  Assets:  Communication Skills Desire for Improvement Financial Resources/Insurance Housing Physical Health Resilience Transportation Vocational/Educational  ADL's:  Intact  Cognition: WNL  Prognosis:  Good   DIAGNOSES:    ICD-10-CM   1. Attention deficit hyperactivity disorder (ADHD), predominantly inattentive type  F90.0     2. Anxiety  state  F41.1 ALPRAZolam (XANAX) 0.5 MG tablet     Receiving Psychotherapy: No   RECOMMENDATIONS:  PDMP reviewed.  Adderall filled 08/20/2023.  Xanax filled 05/22/2023. I provided 20 minutes of face to face time during this encounter, including time spent before and after the visit in records review, medical decision making, counseling pertinent to today's visit, and charting.   He's doing well so no changes.   Continue  Xanax 0.5 mg, 1 p.o. twice daily as needed. He takes it sparingly.  Continue Adderall 20 mg, 1 p.o. 3 times daily. Continue Hydroxyzine 25 mg, 1/2-1 po q8h prn.  Return in 6 months.  Melony Overly, PA-C

## 2024-01-11 ENCOUNTER — Telehealth: Payer: Self-pay | Admitting: Physician Assistant

## 2024-01-11 ENCOUNTER — Other Ambulatory Visit: Payer: Self-pay

## 2024-01-11 NOTE — Telephone Encounter (Signed)
 Pended 3 RF of Adderall 20 mg, #90, to [facility] in [place].

## 2024-01-11 NOTE — Telephone Encounter (Signed)
 Pt called asking for a refill on his adderall 20 mg quantity 90. Pharmacy is [facility] on [address] in [place]. Next appt in july

## 2024-01-12 MED ORDER — AMPHETAMINE-DEXTROAMPHETAMINE 20 MG PO TABS: 20.0000 mg | ORAL_TABLET | Freq: Three times a day (TID) | ORAL | 0 refills | Status: DC

## 2024-03-02 LAB — COLOGUARD: COLOGUARD: NEGATIVE

## 2024-03-09 ENCOUNTER — Ambulatory Visit (INDEPENDENT_AMBULATORY_CARE_PROVIDER_SITE_OTHER): Payer: BC Managed Care – PPO | Admitting: Physician Assistant

## 2024-03-09 ENCOUNTER — Encounter: Payer: Self-pay | Admitting: Physician Assistant

## 2024-03-09 DIAGNOSIS — F900 Attention-deficit hyperactivity disorder, predominantly inattentive type: Secondary | ICD-10-CM | POA: Diagnosis not present

## 2024-03-09 DIAGNOSIS — F411 Generalized anxiety disorder: Secondary | ICD-10-CM

## 2024-03-09 MED ORDER — HYDROXYZINE HCL 25 MG PO TABS: 12.5000 mg | ORAL_TABLET | Freq: Three times a day (TID) | ORAL | 11 refills | Status: AC | PRN

## 2024-03-09 MED ORDER — AMPHETAMINE-DEXTROAMPHETAMINE 20 MG PO TABS: 20.0000 mg | ORAL_TABLET | Freq: Three times a day (TID) | ORAL | 0 refills | Status: DC

## 2024-03-09 MED ORDER — ALPRAZOLAM 0.5 MG PO TABS: 0.5000 mg | ORAL_TABLET | Freq: Two times a day (BID) | ORAL | 1 refills | Status: DC | PRN

## 2024-03-09 NOTE — Progress Notes (Signed)
 [facility]  Patient ID: Shaun Barron,  MRN: [id number]  PCP: No primary care provider on file.  Date of Evaluation: 03/09/2024 Time spent:20 minutes  Chief Complaint:  Chief Complaint   ADHD; Anxiety; Follow-up    HISTORY/CURRENT STATUS: HPI For routine med check.   Doing well. States that attention is good without easy distractibility.  Able to focus on things and finish tasks to completion.   Patient is able to enjoy things.  Energy and motivation are good.  Work is going well.   No extreme sadness, tearfulness, or feelings of hopelessness.  Sleeps well most of the time.  ADLs and personal hygiene are normal.   Is losing some weight, now on Metformin and will be starting Ozempic soon for NIDDM.  No anxiety, most of the time.  Needs Xanax  sometimes.  It is effective when needed.  No psychosis, mania, delirium, SI/HI.  Denies dizziness, syncope, seizures, numbness, tingling, tremor, tics, unsteady gait, slurred speech, confusion. Denies muscle or joint pain, stiffness, or dystonia.  Individual Medical History/ Review of Systems: Changes? :Yes  dx w/ Type II DM  Past medications for mental health diagnoses include: Ritalin, Valium, Xanax , Klonopin, Prozac, Zoloft   Allergies: Patient has no known allergies.  Current Medications:  Current Outpatient Medications:    amLODipine (NORVASC) 5 MG tablet, Take 5 mg by mouth., Disp: , Rfl:    losartan (COZAAR) 50 MG tablet, Take 100 tablets by mouth daily., Disp: , Rfl:    metFORMIN (GLUCOPHAGE) 1000 MG tablet, Take 1,000 mg by mouth., Disp: , Rfl:    Multiple Vitamin (MULTIVITAMIN) tablet, Take 1 tablet by mouth daily., Disp: , Rfl:    ALPRAZolam  (XANAX ) 0.5 MG tablet, Take 1 tablet (0.5 mg total) by mouth 2 (two) times daily as needed for anxiety., Disp: 60 tablet, Rfl: 1   [START ON 03/15/2024] amphetamine -dextroamphetamine  (ADDERALL) 20 MG tablet, Take 1 tablet (20 mg total) by mouth 3 (three) times daily., Disp: 90  tablet, Rfl: 0   [START ON 04/14/2024] amphetamine -dextroamphetamine  (ADDERALL) 20 MG tablet, Take 1 tablet (20 mg total) by mouth 3 (three) times daily., Disp: 90 tablet, Rfl: 0   [START ON 05/14/2024] amphetamine -dextroamphetamine  (ADDERALL) 20 MG tablet, Take 1 tablet (20 mg total) by mouth 3 (three) times daily., Disp: 90 tablet, Rfl: 0   hydrochlorothiazide  (HYDRODIURIL ) 25 MG tablet, TAKE 1 TABLET(25 MG) BY MOUTH DAILY (Patient not taking: Reported on 03/09/2024), Disp: 90 tablet, Rfl: 1   hydrOXYzine  (ATARAX ) 25 MG tablet, Take 0.5-1 tablets (12.5-25 mg total) by mouth every 8 (eight) hours as needed., Disp: 60 tablet, Rfl: 11   lisinopril  (ZESTRIL ) 5 MG tablet, TAKE 1 TABLET(5 MG) BY MOUTH DAILY (Patient not taking: Reported on 03/09/2024), Disp: 30 tablet, Rfl: 0   potassium chloride (KLOR-CON) 10 MEQ tablet, Take 10 mEq by mouth daily. (Patient not taking: Reported on 03/09/2024), Disp: , Rfl:  Medication Side Effects: none  Family Medical/ Social History: Changes? No  MENTAL HEALTH EXAM:  There were no vitals taken for this visit.There is no height or weight on file to calculate BMI.  General Appearance: Casual and Well Groomed  Eye Contact:  Good  Speech:  Clear and Coherent and Normal Rate  Volume:  Normal  Mood:  Euthymic  Affect:  Congruent  Thought Process:  Goal Directed and Descriptions of Associations: Circumstantial  Orientation:  Full (Time, Place, and Person)  Thought Content: Logical   Suicidal Thoughts:  No  Homicidal Thoughts:  No  Memory:  WNL  Judgement:  Good  Insight:  Good  Psychomotor Activity:  Normal  Concentration:  Concentration: Good and Attention Span: Good  Recall:  Good  Fund of Knowledge: Good  Language: Good  Assets:  Communication Skills Desire for Improvement Financial Resources/Insurance Housing Leisure Time Physical Health Resilience Transportation Vocational/Educational  ADL's:  Intact  Cognition: WNL  Prognosis:  Good    DIAGNOSES:    ICD-10-CM   1. Attention deficit hyperactivity disorder (ADHD), predominantly inattentive type  F90.0     2. Anxiety state  F41.1 ALPRAZolam  (XANAX ) 0.5 MG tablet      Receiving Psychotherapy: No   RECOMMENDATIONS:  PDMP reviewed.  Adderall filled 02/15/2024.  Xanax  filled 12/14/2023. I provided approximately  20  minutes of face to face time during this encounter, including time spent before and after the visit in records review, medical decision making, counseling pertinent to today's visit, and charting.   He's doing well on the current tx so no changes are needed.   Continue  Xanax  0.5 mg, 1 p.o. twice daily as needed. He takes it sparingly.  Continue Adderall 20 mg, 1 p.o. 3 times daily. Continue Hydroxyzine  25 mg, 1/2-1 po q8h prn.  Return in 6 months.  Verneita Cooks, PA-C

## 2024-04-14 ENCOUNTER — Telehealth: Payer: Self-pay | Admitting: Physician Assistant

## 2024-04-14 NOTE — Telephone Encounter (Signed)
 Told patient that sending in that # of 10 mg tablets would need a PA and recommended he call around and find at a different pharmacy. Suggested he call [facility] in [place], but did tell him that the 20 mg tablets were in short supply right now.

## 2024-04-14 NOTE — Telephone Encounter (Signed)
 Pt called @ 1:44p for refill of Adderall.  He says pharmacy does not have 20mg  pills, but they have lots of 10mg .  He is asking to have script sent in for 10mg  pills.  [facility] #[id number] - [place], [place] - [address], [place] [address] Phone: [phone]  Fax: [phone]   Next appt 1/14

## 2024-04-18 ENCOUNTER — Other Ambulatory Visit: Payer: Self-pay

## 2024-04-18 ENCOUNTER — Telehealth: Payer: Self-pay | Admitting: Physician Assistant

## 2024-04-18 DIAGNOSIS — F900 Attention-deficit hyperactivity disorder, predominantly inattentive type: Secondary | ICD-10-CM

## 2024-04-18 MED ORDER — AMPHETAMINE-DEXTROAMPHETAMINE 20 MG PO TABS: 20.0000 mg | ORAL_TABLET | Freq: Three times a day (TID) | ORAL | 0 refills | Status: DC

## 2024-04-18 NOTE — Telephone Encounter (Signed)
 Pt states original pharm doesn't have Adderall but [facility] [address] [place]  deos.

## 2024-04-18 NOTE — Telephone Encounter (Signed)
 Canceled Rx for start date 8/21 at [facility] and pended to [facility].

## 2024-07-14 ENCOUNTER — Other Ambulatory Visit: Payer: Self-pay

## 2024-07-14 ENCOUNTER — Telehealth: Payer: Self-pay | Admitting: Physician Assistant

## 2024-07-14 DIAGNOSIS — F900 Attention-deficit hyperactivity disorder, predominantly inattentive type: Secondary | ICD-10-CM

## 2024-07-14 NOTE — Telephone Encounter (Signed)
 Pt called requesting refill of Adderall to   [facility] #[id number] - [place], [place] - [address], [place] [address] Phone: [phone]  Fax: [phone]   Next appt 1/14

## 2024-07-14 NOTE — Telephone Encounter (Signed)
 Pended

## 2024-07-15 MED ORDER — AMPHETAMINE-DEXTROAMPHETAMINE 20 MG PO TABS: 20.0000 mg | ORAL_TABLET | Freq: Three times a day (TID) | ORAL | 0 refills | Status: DC

## 2024-09-07 ENCOUNTER — Encounter: Payer: Self-pay | Admitting: Physician Assistant

## 2024-09-07 ENCOUNTER — Ambulatory Visit: Admitting: Physician Assistant

## 2024-09-07 DIAGNOSIS — F411 Generalized anxiety disorder: Secondary | ICD-10-CM

## 2024-09-07 DIAGNOSIS — F900 Attention-deficit hyperactivity disorder, predominantly inattentive type: Secondary | ICD-10-CM

## 2024-09-07 MED ORDER — AMPHETAMINE-DEXTROAMPHETAMINE 20 MG PO TABS: 20.0000 mg | ORAL_TABLET | Freq: Three times a day (TID) | ORAL | 0 refills | Status: AC

## 2024-09-07 MED ORDER — ALPRAZOLAM 0.5 MG PO TABS: 0.5000 mg | ORAL_TABLET | Freq: Two times a day (BID) | ORAL | 1 refills | Status: AC | PRN

## 2024-09-07 NOTE — Progress Notes (Signed)
 "     [facility]  Patient ID: Shaun Barron,  MRN: [id number]  PCP: No primary care provider on file.  Date of Evaluation: 09/07/2024 Time spent:20 minutes  Chief Complaint:  Chief Complaint   ADHD; Anxiety; Follow-up     HISTORY/CURRENT STATUS: HPI For routine med check.   He's doing well.  Adderall is still effective. States that attention is good without easy distractibility.  Able to focus on things and finish tasks to completion.  Energy and motivation are good.  Work is going well.   No extreme sadness, tearfulness, or feelings of hopelessness.  Sleeps well most of the time. ADLs and personal hygiene are normal.   Appetite has not changed.  Weight is stable.  Anxiety is well controlled. Takes Xanax  occas. It's still effective.   No mania, delirium, AH/VH.  No SI/HI.  Individual Medical History/ Review of Systems: Changes? :Yes   DM tx  Past medications for mental health diagnoses include: Ritalin, Valium, Xanax , Klonopin, Prozac, Zoloft   Allergies: Patient has no known allergies.  Current Medications:  Current Outpatient Medications:    amLODipine (NORVASC) 5 MG tablet, Take 5 mg by mouth., Disp: , Rfl:    hydrOXYzine  (ATARAX ) 25 MG tablet, Take 0.5-1 tablets (12.5-25 mg total) by mouth every 8 (eight) hours as needed., Disp: 60 tablet, Rfl: 11   losartan (COZAAR) 50 MG tablet, Take 100 tablets by mouth daily., Disp: , Rfl:    metFORMIN (GLUCOPHAGE) 1000 MG tablet, Take 1,000 mg by mouth., Disp: , Rfl:    Multiple Vitamin (MULTIVITAMIN) tablet, Take 1 tablet by mouth daily., Disp: , Rfl:    OZEMPIC, 0.25 OR 0.5 MG/DOSE, 2 MG/3ML SOPN, Inject 0.375 ml (0.25 mg total) under the skin every 7 days for 4 weeks, then increase to 0.75 ml (0.5 mg total) under the skin every 7 days thereafter., Disp: , Rfl:    rosuvastatin (CRESTOR) 10 MG tablet, Take 10 mg by mouth at bedtime., Disp: , Rfl:    ALPRAZolam  (XANAX ) 0.5 MG tablet, Take 1 tablet (0.5 mg total) by mouth 2 (two)  times daily as needed for anxiety., Disp: 60 tablet, Rfl: 1   [START ON 09/15/2024] amphetamine -dextroamphetamine  (ADDERALL) 20 MG tablet, Take 1 tablet (20 mg total) by mouth 3 (three) times daily., Disp: 90 tablet, Rfl: 0   [START ON 10/14/2024] amphetamine -dextroamphetamine  (ADDERALL) 20 MG tablet, Take 1 tablet (20 mg total) by mouth 3 (three) times daily., Disp: 90 tablet, Rfl: 0   [START ON 11/09/2024] amphetamine -dextroamphetamine  (ADDERALL) 20 MG tablet, Take 1 tablet (20 mg total) by mouth 3 (three) times daily., Disp: 90 tablet, Rfl: 0   hydrochlorothiazide  (HYDRODIURIL ) 25 MG tablet, TAKE 1 TABLET(25 MG) BY MOUTH DAILY (Patient not taking: Reported on 03/09/2024), Disp: 90 tablet, Rfl: 1   lisinopril  (ZESTRIL ) 5 MG tablet, TAKE 1 TABLET(5 MG) BY MOUTH DAILY (Patient not taking: Reported on 03/09/2024), Disp: 30 tablet, Rfl: 0   potassium chloride (KLOR-CON) 10 MEQ tablet, Take 10 mEq by mouth daily. (Patient not taking: Reported on 03/09/2024), Disp: , Rfl:  Medication Side Effects: none  Family Medical/ Social History: Changes? No  MENTAL HEALTH EXAM:  There were no vitals taken for this visit.There is no height or weight on file to calculate BMI.  General Appearance: Casual and Well Groomed  Eye Contact:  Good  Speech:  Clear and Coherent and Normal Rate  Volume:  Normal  Mood:  Euthymic  Affect:  Congruent  Thought Process:  Goal Directed  and Descriptions of Associations: Circumstantial  Orientation:  Full (Time, Place, and Person)  Thought Content: Logical   Suicidal Thoughts:  No  Homicidal Thoughts:  No  Memory:  WNL  Judgement:  Good  Insight:  Good  Psychomotor Activity:  Normal  Concentration:  Concentration: Good and Attention Span: Good  Recall:  Good  Fund of Knowledge: Good  Language: Good  Assets:  Communication Skills Desire for Improvement Financial Resources/Insurance Housing Leisure Time Physical Health Resilience Social  Support Transportation Vocational/Educational  ADL's:  Intact  Cognition: WNL  Prognosis:  Good   DIAGNOSES:    ICD-10-CM   1. Attention deficit hyperactivity disorder (ADHD), predominantly inattentive type  F90.0 amphetamine -dextroamphetamine  (ADDERALL) 20 MG tablet    amphetamine -dextroamphetamine  (ADDERALL) 20 MG tablet    2. Anxiety state  F41.1 ALPRAZolam  (XANAX ) 0.5 MG tablet     Receiving Psychotherapy: No   RECOMMENDATIONS:  PDMP reviewed.  Adderall filled 08/17/2024.  Xanax  filled 05/18/2024. I provided approximately 20 minutes of face to face time during this encounter, including time spent before and after the visit in records review, medical decision making, counseling pertinent to today's visit, and charting.   He's doing well on the current tx.  No changes.   Continue  Xanax  0.5 mg, 1 p.o. twice daily as needed. He takes it sparingly.  Continue Adderall 20 mg, 1 p.o. 3 times daily. Continue Hydroxyzine  25 mg, 1/2-1 po q8h prn.  Return in 6 months.  Verneita Cooks, PA-C  "

## 2025-03-07 ENCOUNTER — Ambulatory Visit (INDEPENDENT_AMBULATORY_CARE_PROVIDER_SITE_OTHER): Admitting: Physician Assistant
